# Patient Record
Sex: Female | Born: 1960 | Race: Black or African American | Hispanic: No | Marital: Married | Smoking: Former smoker
Health system: Southern US, Community
[De-identification: ages and names within clinical notes are randomized; demographics above are authoritative.]

## PROBLEM LIST (undated history)

## (undated) DIAGNOSIS — T148XXA Other injury of unspecified body region, initial encounter: Secondary | ICD-10-CM

## (undated) DIAGNOSIS — Z862 Personal history of diseases of the blood and blood-forming organs and certain disorders involving the immune mechanism: Secondary | ICD-10-CM

## (undated) DIAGNOSIS — I319 Disease of pericardium, unspecified: Secondary | ICD-10-CM

## (undated) DIAGNOSIS — D649 Anemia, unspecified: Secondary | ICD-10-CM

## (undated) DIAGNOSIS — I252 Old myocardial infarction: Secondary | ICD-10-CM

## (undated) DIAGNOSIS — Z87891 Personal history of nicotine dependence: Secondary | ICD-10-CM

## (undated) HISTORY — DX: Anemia, unspecified: D64.9

## (undated) HISTORY — DX: Personal history of nicotine dependence: Z87.891

## (undated) HISTORY — DX: Disease of pericardium, unspecified: I31.9

## (undated) HISTORY — DX: Other injury of unspecified body region, initial encounter: T14.8XXA

## (undated) HISTORY — DX: Old myocardial infarction: I25.2

## (undated) HISTORY — PX: BREAST LUMPECTOMY: SHX2

## (undated) HISTORY — PX: TONSILLECTOMY: SUR1361

## (undated) HISTORY — DX: Personal history of diseases of the blood and blood-forming organs and certain disorders involving the immune mechanism: Z86.2

---

## 2019-09-19 ENCOUNTER — Other Ambulatory Visit: Payer: Self-pay

## 2019-09-19 ENCOUNTER — Ambulatory Visit: Payer: BC Managed Care – PPO | Attending: Internal Medicine | Admitting: Internal Medicine

## 2019-09-19 ENCOUNTER — Encounter: Payer: Self-pay | Admitting: Internal Medicine

## 2019-09-19 VITALS — BP 135/88 | HR 93 | Temp 97.0°F | Resp 16 | Ht 70.0 in | Wt 216.2 lb

## 2019-09-19 DIAGNOSIS — R030 Elevated blood-pressure reading, without diagnosis of hypertension: Secondary | ICD-10-CM | POA: Diagnosis not present

## 2019-09-19 DIAGNOSIS — E6609 Other obesity due to excess calories: Secondary | ICD-10-CM | POA: Diagnosis not present

## 2019-09-19 DIAGNOSIS — Z1231 Encounter for screening mammogram for malignant neoplasm of breast: Secondary | ICD-10-CM | POA: Diagnosis not present

## 2019-09-19 DIAGNOSIS — Z6831 Body mass index (BMI) 31.0-31.9, adult: Secondary | ICD-10-CM | POA: Insufficient documentation

## 2019-09-19 DIAGNOSIS — Z7689 Persons encountering health services in other specified circumstances: Secondary | ICD-10-CM | POA: Diagnosis not present

## 2019-09-19 NOTE — Patient Instructions (Addendum)
Please sign a release for [place] to get your medical records from your previous PCP. Please check your blood pressure at least twice a week and keep a log.  Please bring in your log with your next visit.   Obesity, Adult Obesity is having too much body fat. Being obese means that your weight is more than what is healthy for you. BMI is a number that explains how much body fat you have. If you have a BMI of 30 or more, you are obese. Obesity is often caused by eating or drinking more calories than your body uses. Changing your lifestyle can help you lose weight. Obesity can cause serious health problems, such as:  Stroke.  Coronary artery disease (CAD).  Type 2 diabetes.  Some types of cancer, including cancers of the colon, breast, uterus, and gallbladder.  Osteoarthritis.  High blood pressure (hypertension).  High cholesterol.  Sleep apnea.  Gallbladder stones.  Infertility problems. What are the causes?  Eating meals each day that are high in calories, sugar, and fat.  Being born with genes that may make you more likely to become obese.  Having a medical condition that causes obesity.  Taking certain medicines.  Sitting a lot (having a sedentary lifestyle).  Not getting enough sleep.  Drinking a lot of drinks that have sugar in them. What increases the risk?  Having a family history of obesity.  Being an [place] American woman.  Being a Hispanic man.  Living in an area with limited access to: ? [facility], recreation centers, or sidewalks. ? Healthy food choices, such as grocery stores and farmers' markets. What are the signs or symptoms? The main sign is having too much body fat. How is this treated?  Treatment for this condition often includes changing your lifestyle. Treatment may include: ? Changing your diet. This may include making a healthy meal plan. ? Exercise. This may include activity that causes your heart to beat faster (aerobic exercise) and strength  training. Work with your doctor to design a program that works for you. ? Medicine to help you lose weight. This may be used if you are not able to lose 1 pound a week after 6 weeks of healthy eating and more exercise. ? Treating conditions that cause the obesity. ? Surgery. Options may include gastric banding and gastric bypass. This may be done if:  Other treatments have not helped to improve your condition.  You have a BMI of 40 or higher.  You have life-threatening health problems related to obesity. Follow these instructions at home: Eating and drinking   Follow advice from your doctor about what to eat and drink. Your doctor may tell you to: ? Limit fast food, sweets, and processed snack foods. ? Choose low-fat options. For example, choose low-fat milk instead of whole milk. ? Eat 5 or more servings of fruits or vegetables each day. ? Eat at home more often. This gives you more control over what you eat. ? Choose healthy foods when you eat out. ? Learn to read food labels. This will help you learn how much food is in 1 serving. ? Keep low-fat snacks available. ? Avoid drinks that have a lot of sugar in them. These include soda, fruit juice, iced tea with sugar, and flavored milk.  Drink enough water to keep your pee (urine) pale yellow.  Do not go on fad diets. Physical activity  Exercise often, as told by your doctor. Most adults should get up to 150 minutes of  moderate-intensity exercise every week.Ask your doctor: ? What types of exercise are safe for you. ? How often you should exercise.  Warm up and stretch before being active.  Do slow stretching after being active (cool down).  Rest between times of being active. Lifestyle  Work with your doctor and a food expert (dietitian) to set a weight-loss goal that is best for you.  Limit your screen time.  Find ways to reward yourself that do not involve food.  Do not drink alcohol if: ? Your doctor tells you not to  drink. ? You are pregnant, may be pregnant, or are planning to become pregnant.  If you drink alcohol: ? Limit how much you use to:  0-1 drink a day for women.  0-2 drinks a day for men. ? Be aware of how much alcohol is in your drink. In the [place] one drink equals one 12 oz bottle of beer (355 mL), one 5 oz glass of wine (148 mL), or one 1 oz glass of hard liquor (44 mL). General instructions  Keep a weight-loss journal. This can help you keep track of: ? The food that you eat. ? How much exercise you get.  Take over-the-counter and prescription medicines only as told by your doctor.  Take vitamins and supplements only as told by your doctor.  Think about joining a support group.  Keep all follow-up visits as told by your doctor. This is important. Contact a doctor if:  You cannot meet your weight loss goal after you have changed your diet and lifestyle for 6 weeks. Get help right away if you:  Are having trouble breathing.  Are having thoughts of harming yourself. Summary  Obesity is having too much body fat.  Being obese means that your weight is more than what is healthy for you.  Work with your doctor to set a weight-loss goal.  Get regular exercise as told by your doctor. This information is not intended to replace advice given to you by your health care provider. Make sure you discuss any questions you have with your health care provider. Document Revised: 11/12/2017 Document Reviewed: 11/12/2017 Elsevier Patient Education  2020 [employer].

## 2019-09-19 NOTE — Progress Notes (Addendum)
Patient ID: Ruth Gilmore, female    DOB: 08/25/1960  MRN: [id number]  CC: New Patient (Initial Visit)   Subjective: Ruth Gilmore is a 59 y.o. female who presents for new patient visit. Her concerns today include:   Previous PCP was in [place].  Moved to [place] 1.5 mth ago.  Husband has family in the area and went to [employer]. No chronic med issues.  Past hx of IDA due to heavy menses from fibroid which corrected after she was menopausal.  Concern about her wgh.  Nervous eating during pandemic. "I was eating all the wrong foods."  She and her spouse was doing a lot of ordering out -Wgh was 170-177 lbs pre-pandemic.  -She use to walk 3-5 miles a day but was too nervous to do so during the pandemic.   Wants to lose 20 lbs. -now back to cooking more  Smoked for 3 mths in high school and has not touch it since then.  BP: Blood pressure noted to be elevated today. Patient reports that her blood pressure increases whenever her weight increases. She also reports being told that she has a component of whitecoat hypertension as her blood pressure tends to go up when she is in a medical setting. No chest pains, shortness of breath, headaches or dizziness.  HM: last MMG as 2019. Not sure when last PAP.  Did ?FIT test last yr.  COVID Pfizer vaccine 06/27/19 and 07/18/19  Past medical, surgical, family history and social history reviewed and updated.  No current outpatient medications on file prior to visit.   No current facility-administered medications on file prior to visit.    No Known Allergies  Social History   Socioeconomic History  . Marital status: Married    Spouse name: Not on file  . Number of children: 1  . Years of education: Not on file  . Highest education level: Bachelor's degree (e.g., BA, AB, BS)  Occupational History  . Not on file  Tobacco Use  . Smoking status: Former Smoker    Types: Cigarettes  . Smokeless tobacco: Never Used  Vaping Use  .  Vaping Use: Never used  Substance and Sexual Activity  . Alcohol use: Never  . Drug use: Never  . Sexual activity: Yes    Birth control/protection: Post-menopausal  Other Topics Concern  . Not on file  Social History Narrative  . Not on file   Social Determinants of Health   Financial Resource Strain:   . Difficulty of Paying Living Expenses:   Food Insecurity:   . Worried About Programme researcher, broadcasting/film/video in the Last Year:   . Barista in the Last Year:   Transportation Needs:   . Freight forwarder (Medical):   Marland Kitchen Lack of Transportation (Non-Medical):   Physical Activity:   . Days of Exercise per Week:   . Minutes of Exercise per Session:   Stress:   . Feeling of Stress :   Social Connections:   . Frequency of Communication with Friends and Family:   . Frequency of Social Gatherings with Friends and Family:   . Attends Religious Services:   . Active Member of Clubs or Organizations:   . Attends Banker Meetings:   Marland Kitchen Marital Status:   Intimate Partner Violence:   . Fear of Current or Ex-Partner:   . Emotionally Abused:   Marland Kitchen Physically Abused:   . Sexually Abused:     Family History  Problem  Relation Age of Onset  . Leukemia Mother   . Hypertension Father   . Stroke Father   . Anemia Daughter     Past Surgical History:  Procedure Laterality Date  . BREAST LUMPECTOMY Left not sure of yr   benign cyst  . TONSILLECTOMY      ROS: Review of Systems Negative except as stated above  PHYSICAL EXAM: BP 135/88   Pulse 93   Temp (!) 97 F (36.1 C)   Resp 16   Ht 5\' 10"  (1.778 m)   Wt 216 lb 3.2 oz (98.1 kg)   SpO2 95%   BMI 31.02 kg/m   BP RT 142/94, LT arm 136/94 Physical Exam  General appearance - alert, well appearing, older African-American female and in no distress Mental status - normal mood, behavior, speech, dress, motor activity, and thought processes Eyes - pupils equal and reactive, extraocular eye movements intact Nose -  normal and patent, no erythema, discharge or polyps Mouth - mucous membranes moist, pharynx normal without lesions Neck - supple, no significant adenopathy. No thyroid enlargement or nodules. Lymphatics -no cervical or axillary lymphadenopathy Chest - clear to auscultation, no wheezes, rales or rhonchi, symmetric air entry Heart - normal rate, regular rhythm, normal S1, S2, no murmurs, rubs, clicks or gallops Extremities - peripheral pulses normal, no pedal edema, no clubbing or cyanosis   ASSESSMENT AND PLAN: 1. Encounter to establish care -pt wants to hold off on routine labs because she recently had them done through her PCP in [place].  Will sign a release for [place] to get records.  2. Class 1 obesity due to excess calories without serious comorbidity with body mass index (BMI) of 31.0 to 31.9 in adult Discussed and encourage healthy eating habits. In particular, I recommend avoiding sugary drinks, decreasing portion sizes of white carbohydrates, eating more lean meat instead of red meat or pork, and incorporating fresh fruits and vegetables into the diet. Patient is amenable to meeting with a nutritionist. -We also discussed the need for regular exercise with goal of getting in 150 minutes of moderate intensity exercise per week - Amb ref to Medical Nutrition Therapy-MNT  3. Elevated blood pressure reading without diagnosis of hypertension Discussed and encouraged DASH diet. Patient will purchase home blood pressure monitoring device. Told to check blood pressure at least twice a week and record her readings and bring those in with her on next visit.  4. Encounter for screening mammogram for malignant neoplasm of breast - MM Digital Screening; Future  Patient to sign a release at checkout first to get her medical records from her previous PCP  Patient was given the opportunity to ask questions.  Patient verbalized understanding of the plan and was able to repeat key elements of the  plan.   Orders Placed This Encounter  Procedures  . MM Digital Screening  . Amb ref to Medical Nutrition Therapy-MNT     Requested Prescriptions    No prescriptions requested or ordered in this encounter    Return in about 2 months (around 11/19/2019) for well woman exam/pap.  Karle Plumber, MD, FACP

## 2019-10-19 ENCOUNTER — Encounter: Payer: Self-pay | Admitting: Internal Medicine

## 2019-10-19 NOTE — Progress Notes (Signed)
I received patient records from [facility]. Labs: Labs done 01/20/2019 revealed A1c of 5.6, hepatitis C screen negative, CBC normal with hemoglobin and hematocrit of 13.8 and 42, lipid profile revealed total cholesterol of 170 and LDL cholesterol of 102.  Fit test done 01/26/2019 was negative.  Hepatic function panel done 05/23/2019 revealed mild elevation in ALT of 37.  Other liver enzymes were normal.  Patient had ultrasound of the abdomen complete done 03/14/2019 because of elevated liver enzymes.  This ultrasound was normal which included views of the pancreas, kidneys, spleen, liver, aortic and bifurcation and inferior vena cava. There was no mammogram or Pap smear report.

## 2019-11-21 ENCOUNTER — Other Ambulatory Visit: Payer: Self-pay

## 2019-11-21 ENCOUNTER — Ambulatory Visit: Payer: BC Managed Care – PPO | Attending: Internal Medicine | Admitting: Internal Medicine

## 2019-11-21 ENCOUNTER — Other Ambulatory Visit (HOSPITAL_COMMUNITY)
Admission: RE | Admit: 2019-11-21 | Discharge: 2019-11-21 | Disposition: A | Discharge status: Home / Self Care | Payer: BC Managed Care – PPO | Source: Ambulatory Visit | Attending: Internal Medicine | Admitting: Internal Medicine

## 2019-11-21 ENCOUNTER — Ambulatory Visit (HOSPITAL_BASED_OUTPATIENT_CLINIC_OR_DEPARTMENT_OTHER): Payer: BC Managed Care – PPO | Admitting: Pharmacist

## 2019-11-21 ENCOUNTER — Encounter: Payer: Self-pay | Admitting: Internal Medicine

## 2019-11-21 VITALS — BP 122/96 | HR 80 | Temp 98.4°F | Resp 16 | Ht 70.0 in | Wt 216.6 lb

## 2019-11-21 DIAGNOSIS — Z23 Encounter for immunization: Secondary | ICD-10-CM

## 2019-11-21 DIAGNOSIS — Z6831 Body mass index (BMI) 31.0-31.9, adult: Secondary | ICD-10-CM

## 2019-11-21 DIAGNOSIS — Z0000 Encounter for general adult medical examination without abnormal findings: Secondary | ICD-10-CM

## 2019-11-21 DIAGNOSIS — Z5320 Procedure and treatment not carried out because of patient's decision for unspecified reasons: Secondary | ICD-10-CM

## 2019-11-21 DIAGNOSIS — Z1231 Encounter for screening mammogram for malignant neoplasm of breast: Secondary | ICD-10-CM

## 2019-11-21 DIAGNOSIS — R030 Elevated blood-pressure reading, without diagnosis of hypertension: Secondary | ICD-10-CM | POA: Diagnosis not present

## 2019-11-21 DIAGNOSIS — H6122 Impacted cerumen, left ear: Secondary | ICD-10-CM

## 2019-11-21 DIAGNOSIS — Z124 Encounter for screening for malignant neoplasm of cervix: Secondary | ICD-10-CM | POA: Diagnosis not present

## 2019-11-21 DIAGNOSIS — N841 Polyp of cervix uteri: Secondary | ICD-10-CM

## 2019-11-21 DIAGNOSIS — E6609 Other obesity due to excess calories: Secondary | ICD-10-CM

## 2019-11-21 NOTE — Progress Notes (Signed)
Patient presents for vaccination against influenza per orders of Dr. Johnson. Consent given. Counseling provided. No contraindications exists. Vaccine administered without incident.  ° °Luke Van Ausdall, PharmD, CPP °Clinical Pharmacist °[facility] °[phone] ° °

## 2019-11-21 NOTE — Progress Notes (Signed)
Patient ID: Ruth Gilmore, female    DOB: 1960/10/26  MRN: [id number]  CC: Annual Exam and Gynecologic Exam   Subjective: Ruth Gilmore is a 59 y.o. female who presents for physical exam Her concerns today include:  No chronic med issues.  Past hx of IDA due to heavy menses from fibroid which corrected after she was menopausal, obesity.  HM:  Had Tdap at hosp 11/2018 in [place] when she went to hosp for dog bite. Need flu shot.  MMG ordered on last visit.  She was not called.  Obesity:  Opted not to do the nutritionist because it is not covered by her insurance.  She and her husband have been walking almost daily.  She walks about 3 miles a day with plans to get up to about 5 miles a day.  She has been cooking more rather than doing fast food.  Pt is G2P1 No abn Pap in past but last one was years ago She is post menopausal.  No post menopausal bleeding.  Hx of fibroids No vaginal dischg No dysuria Does not check breast intermittently.  Had mass removed from LT breast 4 yrs ago.  Not cancerous. No fhx of breast, cervical, uterine or ovarian cancer  Plan was to recheck BP today. She has a home device but has not been checking it. She limits salt in the foods.  Cooking more.     Patient Active Problem List   Diagnosis Date Noted  . Class 1 obesity due to excess calories without serious comorbidity with body mass index (BMI) of 31.0 to 31.9 in adult 09/19/2019  . Elevated blood pressure reading without diagnosis of hypertension 09/19/2019     No current outpatient medications on file prior to visit.   No current facility-administered medications on file prior to visit.    No Known Allergies  Social History   Socioeconomic History  . Marital status: Married    Spouse name: Not on file  . Number of children: 1  . Years of education: Not on file  . Highest education level: Bachelor's degree (e.g., BA, AB, BS)  Occupational History  . Not on file  Tobacco Use  . Smoking  status: Former Smoker    Types: Cigarettes  . Smokeless tobacco: Never Used  Vaping Use  . Vaping Use: Never used  Substance and Sexual Activity  . Alcohol use: Never  . Drug use: Never  . Sexual activity: Yes    Birth control/protection: Post-menopausal  Other Topics Concern  . Not on file  Social History Narrative  . Not on file   Social Determinants of Health   Financial Resource Strain:   . Difficulty of Paying Living Expenses: Not on file  Food Insecurity:   . Worried About Programme researcher, broadcasting/film/video in the Last Year: Not on file  . Ran Out of Food in the Last Year: Not on file  Transportation Needs:   . Lack of Transportation (Medical): Not on file  . Lack of Transportation (Non-Medical): Not on file  Physical Activity:   . Days of Exercise per Week: Not on file  . Minutes of Exercise per Session: Not on file  Stress:   . Feeling of Stress : Not on file  Social Connections:   . Frequency of Communication with Friends and Family: Not on file  . Frequency of Social Gatherings with Friends and Family: Not on file  . Attends Religious Services: Not on file  . Active Member of Clubs  or Organizations: Not on file  . Attends Banker Meetings: Not on file  . Marital Status: Not on file  Intimate Partner Violence:   . Fear of Current or Ex-Partner: Not on file  . Emotionally Abused: Not on file  . Physically Abused: Not on file  . Sexually Abused: Not on file    Family History  Problem Relation Age of Onset  . Leukemia Mother   . Hypertension Father   . Stroke Father   . Anemia Daughter     Past Surgical History:  Procedure Laterality Date  . BREAST LUMPECTOMY Left not sure of yr   benign cyst  . TONSILLECTOMY      ROS: Review of Systems  Constitutional: Negative for fever.  HENT: Negative for dental problem and hearing loss.        No dental issues at this time but has not seen a dentist in a while.  Eyes:       Last eye exam was about 2 years ago.   She wears glasses.  Respiratory: Negative for cough.   Cardiovascular: Negative for chest pain.    PHYSICAL EXAM: BP (!) 122/96   Pulse 80   Temp 98.4 F (36.9 C)   Resp 16   Ht 5\' 10"  (1.778 m)   Wt 216 lb 9.6 oz (98.2 kg)   SpO2 97%   BMI 31.08 kg/m   Wt Readings from Last 3 Encounters:  11/21/19 216 lb 9.6 oz (98.2 kg)  09/19/19 216 lb 3.2 oz (98.1 kg)    Physical Exam  General appearance - alert, well appearing, older African-American female and in no distress Mental status - normal mood, behavior, speech, dress, motor activity, and thought processes Eyes - pupils equal and reactive, extraocular eye movements intact Ears -small amount of soft wax buildup in the right ear canal.  Tympanic membrane is normal.  She has moderate amount of soft wax in the left ear obscuring view of the tympanic membrane. Nose -mild enlargement of nasal turbinates bilaterally. Mouth - mucous membranes moist, pharynx normal without lesions Neck - supple, no significant adenopathy Lymphatics - no palpable lymphadenopathy, no hepatosplenomegaly Chest - clear to auscultation, no wheezes, rales or rhonchi, symmetric air entry Heart - normal rate, regular rhythm, normal S1, S2, no murmurs, rubs, clicks or gallops Abdomen - soft, nontender, nondistended, no masses or organomegaly Breasts -CMA Pollock present for breast and pelvic exam.  Breasts appear normal, no suspicious masses, no skin or nipple changes or axillary nodes Pelvic - normal external genitalia, vulva, vagina, cervix, uterus and adnexa.  She has what looks like a polyp in the cervical os. Extremities -no lower extremity edema.   ASSESSMENT AND PLAN: 1. Annual physical exam 2. Pap smear for cervical cancer screening Patient declined STD screening. - Cytology - PAP  3. Polyp at cervical os - Ambulatory referral to Gynecology  4. Elevated blood-pressure reading without diagnosis of hypertension Continue healthy eating habits and  regular exercise.  I have asked her to check her blood pressure at least twice a week and record the readings.  She will see the clinical pharmacist in 1 month for recheck.  If home blood pressure readings are elevated, I would recommend starting her on amlodipine 5 mg daily.  5. Class 1 obesity due to excess calories without serious comorbidity with body mass index (BMI) of 31.0 to 31.9 in adult Commended her on changes that she has made in her eating habits.  Continue regular exercise  6. Impacted cerumen of left ear Patient states that she did not tolerate ear irrigation in the past.  Advised to purchase them over-the-counter wax softener and use it  7. HIV screening declined This was offered.  Patient declined  8. Encounter for screening mammogram for malignant neoplasm of breast - MM Digital Screening; Future  9. Need for influenza vaccination Given today.     Patient was given the opportunity to ask questions.  Patient verbalized understanding of the plan and was able to repeat key elements of the plan.   Orders Placed This Encounter  Procedures  . MM Digital Screening  . Ambulatory referral to Gynecology     Requested Prescriptions    No prescriptions requested or ordered in this encounter    Return in about 4 months (around 03/22/2020) for [facility] in 1 mth for BP recheck.  Jonah Blue, MD, FACP

## 2019-11-21 NOTE — Patient Instructions (Addendum)
Try to get established with a dentist for your routine dental care. Remember to schedule an eye appointment.  Try to check your blood pressure at least twice a week.  Please keep a log and bring them in in 1 month when you come to see the clinical pharmacist for recheck of your blood pressure.  Influenza Virus Vaccine injection (Fluarix) What is this medicine? INFLUENZA VIRUS VACCINE (in floo EN zuh VAHY ruhs vak SEEN) helps to reduce the risk of getting influenza also known as the flu. This medicine may be used for other purposes; ask your health care provider or pharmacist if you have questions. COMMON BRAND NAME(S): Fluarix, Fluzone What should I tell my health care provider before I take this medicine? They need to know if you have any of these conditions:  bleeding disorder like hemophilia  fever or infection  Guillain-Barre syndrome or other neurological problems  immune system problems  infection with the human immunodeficiency virus (HIV) or AIDS  low blood platelet counts  multiple sclerosis  an unusual or allergic reaction to influenza virus vaccine, eggs, chicken proteins, latex, gentamicin, other medicines, foods, dyes or preservatives  pregnant or trying to get pregnant  breast-feeding How should I use this medicine? This vaccine is for injection into a muscle. It is given by a health care professional. A copy of Vaccine Information Statements will be given before each vaccination. Read this sheet carefully each time. The sheet may change frequently. Talk to your pediatrician regarding the use of this medicine in children. Special care may be needed. Overdosage: If you think you have taken too much of this medicine contact a poison control center or emergency room at once. NOTE: This medicine is only for you. Do not share this medicine with others. What if I miss a dose? This does not apply. What may interact with this medicine?  chemotherapy or radiation  therapy  medicines that lower your immune system like etanercept, anakinra, infliximab, and adalimumab  medicines that treat or prevent blood clots like warfarin  phenytoin  steroid medicines like prednisone or cortisone  theophylline  vaccines This list may not describe all possible interactions. Give your health care provider a list of all the medicines, herbs, non-prescription drugs, or dietary supplements you use. Also tell them if you smoke, drink alcohol, or use illegal drugs. Some items may interact with your medicine. What should I watch for while using this medicine? Report any side effects that do not go away within 3 days to your doctor or health care professional. Call your health care provider if any unusual symptoms occur within 6 weeks of receiving this vaccine. You may still catch the flu, but the illness is not usually as bad. You cannot get the flu from the vaccine. The vaccine will not protect against colds or other illnesses that may cause fever. The vaccine is needed every year. What side effects may I notice from receiving this medicine? Side effects that you should report to your doctor or health care professional as soon as possible:  allergic reactions like skin rash, itching or hives, swelling of the face, lips, or tongue Side effects that usually do not require medical attention (report to your doctor or health care professional if they continue or are bothersome):  fever  headache  muscle aches and pains  pain, tenderness, redness, or swelling at site where injected  weak or tired This list may not describe all possible side effects. Call your doctor for medical advice about  side effects. You may report side effects to FDA at 1-800-FDA-1088. Where should I keep my medicine? This vaccine is only given in a clinic, pharmacy, doctor's office, or other health care setting and will not be stored at home. NOTE: This sheet is a summary. It may not cover all  possible information. If you have questions about this medicine, talk to your doctor, pharmacist, or health care provider.  2020 Elsevier/Gold Standard (2007-10-06 09:30:40)

## 2019-11-22 LAB — CYTOLOGY - PAP
Comment: NEGATIVE
Diagnosis: NEGATIVE
High risk HPV: NEGATIVE

## 2019-12-06 ENCOUNTER — Other Ambulatory Visit: Payer: Self-pay

## 2019-12-06 ENCOUNTER — Ambulatory Visit
Admission: RE | Admit: 2019-12-06 | Discharge: 2019-12-06 | Disposition: A | Discharge status: Home / Self Care | Payer: BC Managed Care – PPO | Source: Ambulatory Visit | Attending: Internal Medicine | Admitting: Internal Medicine

## 2019-12-06 DIAGNOSIS — Z1231 Encounter for screening mammogram for malignant neoplasm of breast: Secondary | ICD-10-CM

## 2019-12-22 ENCOUNTER — Ambulatory Visit: Payer: BC Managed Care – PPO | Admitting: Pharmacist

## 2020-01-12 ENCOUNTER — Encounter: Payer: BC Managed Care – PPO | Admitting: Obstetrics and Gynecology

## 2021-12-24 IMAGING — MG DIGITAL SCREENING BILAT W/ CAD
4 series · 4 of 4 positions shown · non-contrast
Comparison: None.

CLINICAL DATA: Screening.

EXAM:
DIGITAL SCREENING BILATERAL MAMMOGRAM WITH CAD

[L MLO]
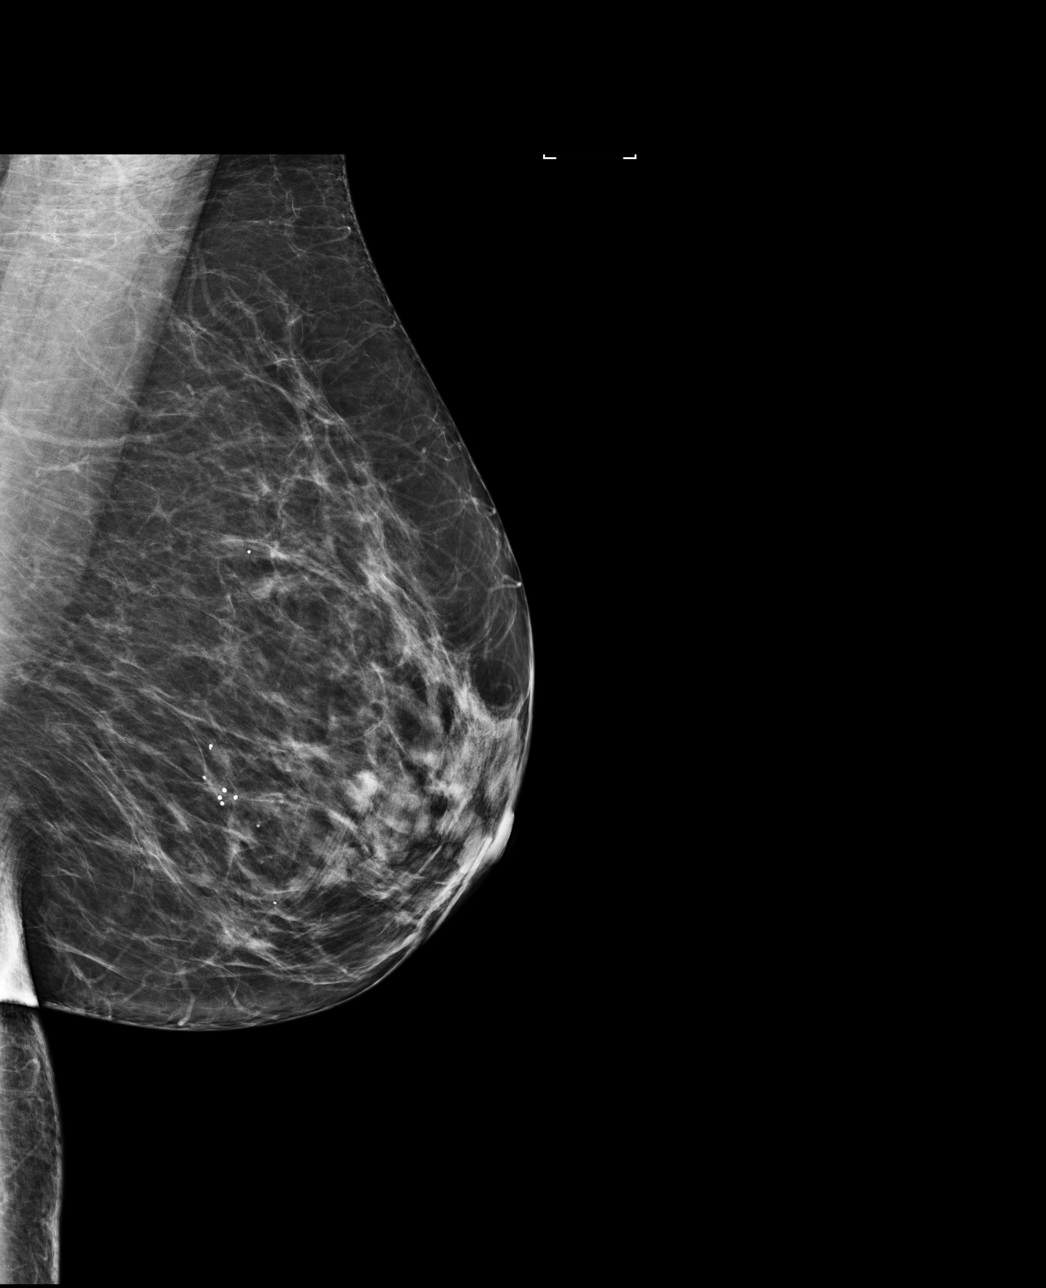

[R CC]
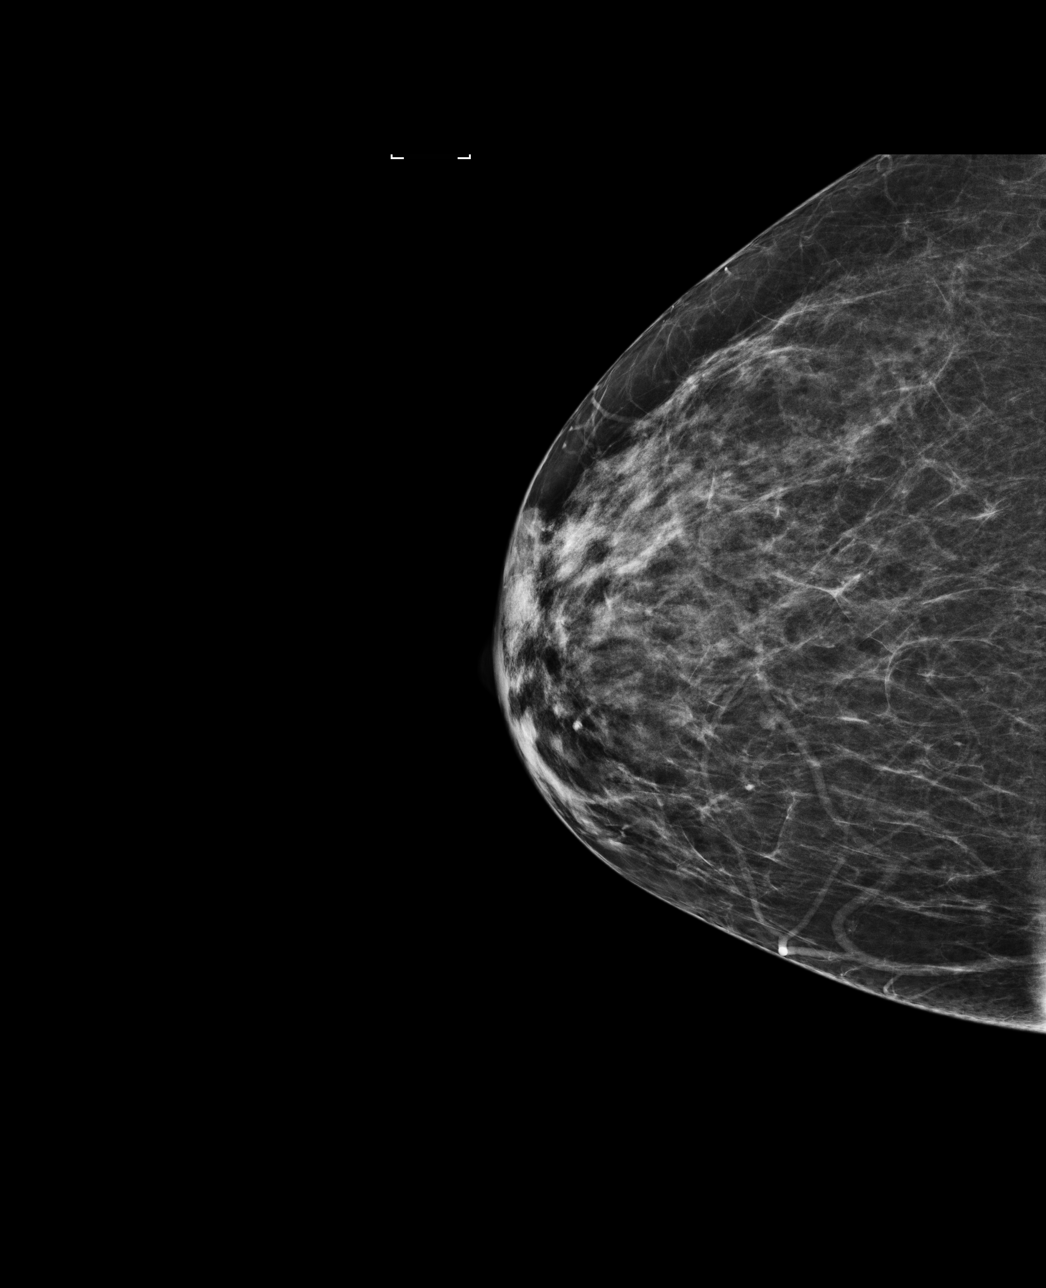

[L CC]
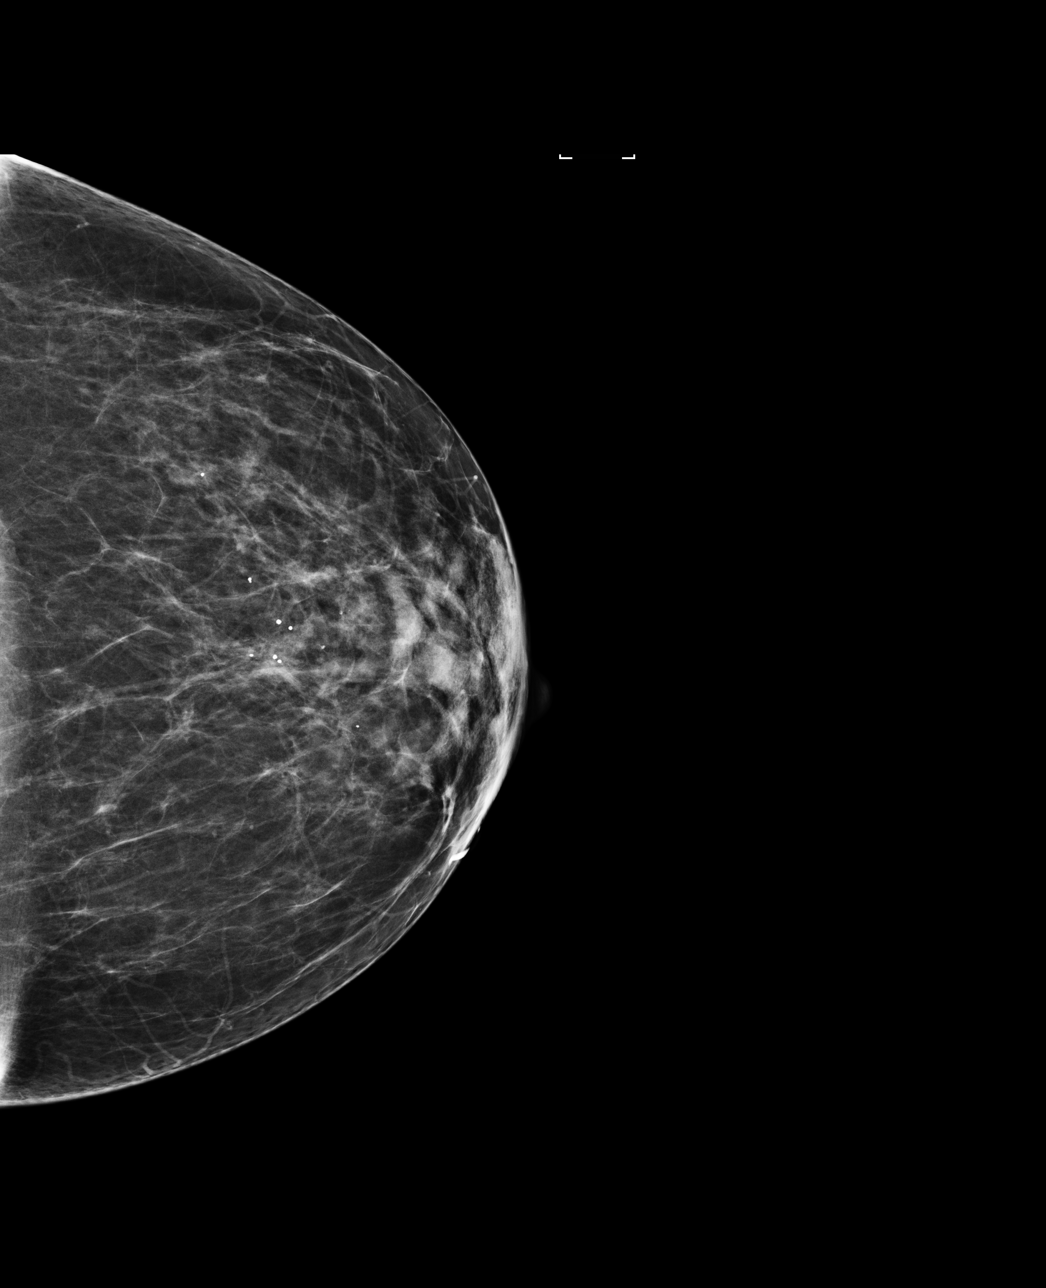

[R MLO]
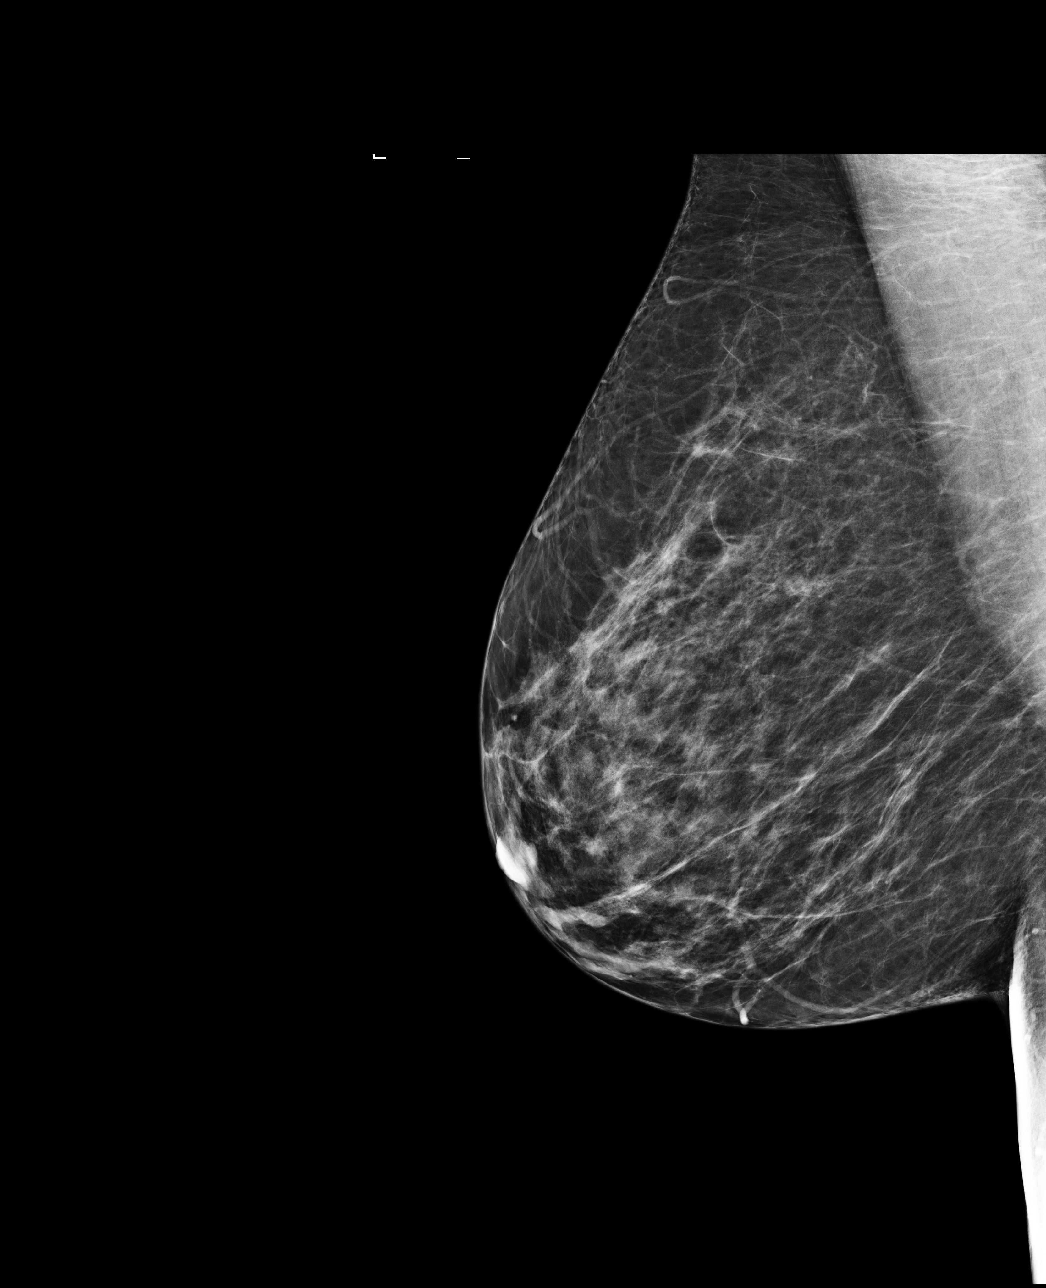

[4 of 4 positions shown; findings below may reference images not displayed]

ACR Breast Density Category c: The breast tissue is heterogeneously
dense, which may obscure small masses
FINDINGS: There are no findings suspicious for malignancy. Images were
processed with CAD.
IMPRESSION: No mammographic evidence of malignancy. A result letter of this
screening mammogram will be mailed directly to the patient.

RECOMMENDATION:
Screening mammogram in one year. (Code:U2-0-761)

BI-RADS CATEGORY  1: Negative.

## 2022-01-17 ENCOUNTER — Inpatient Hospital Stay (HOSPITAL_COMMUNITY)
Admission: EM | Admit: 2022-01-17 | Discharge: 2022-01-19 | DRG: 287 | Disposition: A | Discharge status: Home / Self Care | Payer: BC Managed Care – PPO | Source: Ambulatory Visit | Attending: Interventional Cardiology | Admitting: Interventional Cardiology

## 2022-01-17 ENCOUNTER — Other Ambulatory Visit: Payer: Self-pay

## 2022-01-17 ENCOUNTER — Emergency Department (HOSPITAL_COMMUNITY): Payer: BC Managed Care – PPO

## 2022-01-17 ENCOUNTER — Encounter (HOSPITAL_COMMUNITY): Payer: Self-pay | Admitting: Emergency Medicine

## 2022-01-17 ENCOUNTER — Encounter (HOSPITAL_COMMUNITY)
Admission: EM | Disposition: A | Discharge status: Home / Self Care | Payer: Self-pay | Source: Ambulatory Visit | Attending: Interventional Cardiology

## 2022-01-17 ENCOUNTER — Ambulatory Visit
Admission: EM | Admit: 2022-01-17 | Discharge: 2022-01-17 | Disposition: A | Discharge status: Other Acute Inpatient Hospital | Payer: BC Managed Care – PPO | Attending: Physician Assistant | Admitting: Physician Assistant

## 2022-01-17 DIAGNOSIS — R9431 Abnormal electrocardiogram [ECG] [EKG]: Secondary | ICD-10-CM | POA: Diagnosis not present

## 2022-01-17 DIAGNOSIS — I213 ST elevation (STEMI) myocardial infarction of unspecified site: Secondary | ICD-10-CM | POA: Diagnosis not present

## 2022-01-17 DIAGNOSIS — Z806 Family history of leukemia: Secondary | ICD-10-CM | POA: Diagnosis not present

## 2022-01-17 DIAGNOSIS — Z823 Family history of stroke: Secondary | ICD-10-CM

## 2022-01-17 DIAGNOSIS — R7989 Other specified abnormal findings of blood chemistry: Secondary | ICD-10-CM | POA: Diagnosis present

## 2022-01-17 DIAGNOSIS — R42 Dizziness and giddiness: Secondary | ICD-10-CM

## 2022-01-17 DIAGNOSIS — I309 Acute pericarditis, unspecified: Secondary | ICD-10-CM | POA: Diagnosis present

## 2022-01-17 DIAGNOSIS — R079 Chest pain, unspecified: Principal | ICD-10-CM

## 2022-01-17 DIAGNOSIS — Z888 Allergy status to other drugs, medicaments and biological substances status: Secondary | ICD-10-CM

## 2022-01-17 DIAGNOSIS — I319 Disease of pericardium, unspecified: Secondary | ICD-10-CM | POA: Diagnosis not present

## 2022-01-17 DIAGNOSIS — Z87891 Personal history of nicotine dependence: Secondary | ICD-10-CM

## 2022-01-17 DIAGNOSIS — R0602 Shortness of breath: Secondary | ICD-10-CM | POA: Diagnosis not present

## 2022-01-17 DIAGNOSIS — R0789 Other chest pain: Secondary | ICD-10-CM

## 2022-01-17 DIAGNOSIS — Z8249 Family history of ischemic heart disease and other diseases of the circulatory system: Secondary | ICD-10-CM

## 2022-01-17 HISTORY — PX: LEFT HEART CATH AND CORONARY ANGIOGRAPHY: CATH118249

## 2022-01-17 LAB — HEPATIC FUNCTION PANEL
ALT: 14 U/L (ref 0–44)
AST: 33 U/L (ref 15–41)
Albumin: 3.8 g/dL (ref 3.5–5.0)
Alkaline Phosphatase: 53 U/L (ref 38–126)
Bilirubin, Direct: 0.1 mg/dL (ref 0.0–0.2)
Indirect Bilirubin: 0.5 mg/dL (ref 0.3–0.9)
Total Bilirubin: 0.6 mg/dL (ref 0.3–1.2)
Total Protein: 7 g/dL (ref 6.5–8.1)

## 2022-01-17 LAB — CBC
HCT: 40.4 % (ref 36.0–46.0)
Hemoglobin: 13 g/dL (ref 12.0–15.0)
MCH: 28.2 pg (ref 26.0–34.0)
MCHC: 32.2 g/dL (ref 30.0–36.0)
MCV: 87.6 fL (ref 80.0–100.0)
Platelets: 244 10*3/uL (ref 150–400)
RBC: 4.61 MIL/uL (ref 3.87–5.11)
RDW: 14.9 % (ref 11.5–15.5)
WBC: 4.3 10*3/uL (ref 4.0–10.5)
nRBC: 0 % (ref 0.0–0.2)

## 2022-01-17 LAB — TSH: TSH: 1.54 u[IU]/mL (ref 0.350–4.500)

## 2022-01-17 LAB — BASIC METABOLIC PANEL
Anion gap: 11 (ref 5–15)
BUN: 15 mg/dL (ref 8–23)
CO2: 22 mmol/L (ref 22–32)
Calcium: 10.1 mg/dL (ref 8.9–10.3)
Chloride: 104 mmol/L (ref 98–111)
Creatinine, Ser: 0.99 mg/dL (ref 0.44–1.00)
GFR, Estimated: >60 mL/min (ref >60)
Glucose, Bld: 90 mg/dL (ref 70–99)
Potassium: 3.8 mmol/L (ref 3.5–5.1)
Sodium: 137 mmol/L (ref 135–145)

## 2022-01-17 LAB — PROTIME-INR
INR: 1.2 (ref 0.8–1.2)
Prothrombin Time: 14.8 seconds (ref 11.4–15.2)

## 2022-01-17 LAB — LIPID PANEL
Cholesterol: 163 mg/dL (ref 0–200)
HDL: 49 mg/dL (ref >40)
Total CHOL/HDL Ratio: 3.3 RATIO
Triglycerides: 101 mg/dL (ref <150)
VLDL: 20 mg/dL (ref 0–40)

## 2022-01-17 LAB — TROPONIN I (HIGH SENSITIVITY)
Troponin I (High Sensitivity): 1361 ng/L (ref <18)
Troponin I (High Sensitivity): 1428 ng/L (ref <18)

## 2022-01-17 LAB — APTT: aPTT: 144 seconds — ABNORMAL HIGH (ref 24–36)

## 2022-01-17 LAB — MAGNESIUM: Magnesium: 1.9 mg/dL (ref 1.7–2.4)

## 2022-01-17 LAB — HEMOGLOBIN A1C
Hgb A1c MFr Bld: 5.4 % (ref 4.8–5.6)
Mean Plasma Glucose: 108.28 mg/dL

## 2022-01-17 LAB — HIV ANTIBODY (ROUTINE TESTING W REFLEX): HIV Screen 4th Generation wRfx: NONREACTIVE

## 2022-01-17 SURGERY — LEFT HEART CATH AND CORONARY ANGIOGRAPHY
Anesthesia: LOCAL

## 2022-01-17 MED ORDER — ACETAMINOPHEN 325 MG PO TABS: 650.0000 mg | ORAL_TABLET | ORAL | Status: DC | PRN

## 2022-01-17 MED ORDER — FENTANYL CITRATE (PF) 100 MCG/2ML IJ SOLN
INTRAMUSCULAR | Status: DC | PRN
  Administered 2022-01-17: 25 ug via INTRAVENOUS

## 2022-01-17 MED ORDER — LIDOCAINE HCL (PF) 1 % IJ SOLN
INTRAMUSCULAR | Status: AC
  Filled 2022-01-17: qty 30

## 2022-01-17 MED ORDER — ASPIRIN 81 MG PO CHEW
324.0000 mg | CHEWABLE_TABLET | Freq: Once | ORAL | Status: AC
  Administered 2022-01-17: 324 mg via ORAL

## 2022-01-17 MED ORDER — LABETALOL HCL 5 MG/ML IV SOLN: 10.0000 mg | INTRAVENOUS | Status: AC | PRN

## 2022-01-17 MED ORDER — MIDAZOLAM HCL 2 MG/2ML IJ SOLN
INTRAMUSCULAR | Status: DC | PRN
  Administered 2022-01-17: 1 mg via INTRAVENOUS

## 2022-01-17 MED ORDER — HEPARIN SODIUM (PORCINE) 1000 UNIT/ML IJ SOLN
INTRAMUSCULAR | Status: AC
  Filled 2022-01-17: qty 10

## 2022-01-17 MED ORDER — FENTANYL CITRATE (PF) 100 MCG/2ML IJ SOLN
INTRAMUSCULAR | Status: AC
  Filled 2022-01-17: qty 2

## 2022-01-17 MED ORDER — ASPIRIN 81 MG PO CHEW: 81.0000 mg | CHEWABLE_TABLET | ORAL | Status: AC

## 2022-01-17 MED ORDER — ASPIRIN 81 MG PO CHEW: 324.0000 mg | CHEWABLE_TABLET | ORAL | Status: DC

## 2022-01-17 MED ORDER — SODIUM CHLORIDE 0.9% FLUSH: 3.0000 mL | INTRAVENOUS | Status: DC | PRN

## 2022-01-17 MED ORDER — ASPIRIN 81 MG PO TBEC
81.0000 mg | DELAYED_RELEASE_TABLET | Freq: Every day | ORAL | Status: DC
  Administered 2022-01-18 – 2022-01-19 (×2): 81 mg via ORAL
  Filled 2022-01-17 (×2): qty 1

## 2022-01-17 MED ORDER — SODIUM CHLORIDE 0.9% FLUSH
3.0000 mL | Freq: Two times a day (BID) | INTRAVENOUS | Status: DC
  Administered 2022-01-17 – 2022-01-18 (×2): 3 mL via INTRAVENOUS

## 2022-01-17 MED ORDER — SODIUM CHLORIDE 0.9 % IV SOLN: INTRAVENOUS | Status: AC

## 2022-01-17 MED ORDER — NITROGLYCERIN 1 MG/10 ML FOR IR/CATH LAB
INTRA_ARTERIAL | Status: AC
  Filled 2022-01-17: qty 10

## 2022-01-17 MED ORDER — ASPIRIN 300 MG RE SUPP: 300.0000 mg | RECTAL | Status: DC

## 2022-01-17 MED ORDER — LIDOCAINE HCL (PF) 1 % IJ SOLN
INTRAMUSCULAR | Status: DC | PRN
  Administered 2022-01-17: 2 mL

## 2022-01-17 MED ORDER — HEPARIN SODIUM (PORCINE) 5000 UNIT/ML IJ SOLN: 4000.0000 [IU] | Freq: Once | INTRAMUSCULAR | Status: DC

## 2022-01-17 MED ORDER — ONDANSETRON HCL 4 MG/2ML IJ SOLN: 4.0000 mg | Freq: Four times a day (QID) | INTRAMUSCULAR | Status: DC | PRN

## 2022-01-17 MED ORDER — SODIUM CHLORIDE 0.9 % IV SOLN: INTRAVENOUS | Status: DC

## 2022-01-17 MED ORDER — METOPROLOL TARTRATE 12.5 MG HALF TABLET
12.5000 mg | ORAL_TABLET | Freq: Two times a day (BID) | ORAL | Status: DC
  Administered 2022-01-17 – 2022-01-18 (×3): 12.5 mg via ORAL
  Filled 2022-01-17 (×3): qty 1

## 2022-01-17 MED ORDER — ATORVASTATIN CALCIUM 80 MG PO TABS
80.0000 mg | ORAL_TABLET | Freq: Every day | ORAL | Status: DC
  Administered 2022-01-17 – 2022-01-19 (×3): 80 mg via ORAL
  Filled 2022-01-17 (×4): qty 1

## 2022-01-17 MED ORDER — VERAPAMIL HCL 2.5 MG/ML IV SOLN
INTRAVENOUS | Status: DC | PRN
  Administered 2022-01-17: 10 mL via INTRA_ARTERIAL

## 2022-01-17 MED ORDER — HEPARIN SODIUM (PORCINE) 1000 UNIT/ML IJ SOLN
INTRAMUSCULAR | Status: DC | PRN
  Administered 2022-01-17: 8000 [IU] via INTRAVENOUS

## 2022-01-17 MED ORDER — VERAPAMIL HCL 2.5 MG/ML IV SOLN
INTRAVENOUS | Status: AC
  Filled 2022-01-17: qty 2

## 2022-01-17 MED ORDER — HEPARIN (PORCINE) IN NACL 1000-0.9 UT/500ML-% IV SOLN
INTRAVENOUS | Status: DC | PRN
  Administered 2022-01-17 (×2): 500 mL

## 2022-01-17 MED ORDER — SODIUM CHLORIDE 0.9 % IV SOLN: 250.0000 mL | INTRAVENOUS | Status: DC | PRN

## 2022-01-17 MED ORDER — NITROGLYCERIN 0.4 MG SL SUBL: 0.4000 mg | SUBLINGUAL_TABLET | SUBLINGUAL | Status: DC | PRN

## 2022-01-17 MED ORDER — ASPIRIN 81 MG PO CHEW: 324.0000 mg | CHEWABLE_TABLET | Freq: Once | ORAL | Status: DC

## 2022-01-17 MED ORDER — HYDRALAZINE HCL 20 MG/ML IJ SOLN: 10.0000 mg | INTRAMUSCULAR | Status: AC | PRN

## 2022-01-17 MED ORDER — HEPARIN (PORCINE) IN NACL 1000-0.9 UT/500ML-% IV SOLN
INTRAVENOUS | Status: AC
  Filled 2022-01-17: qty 1000

## 2022-01-17 MED ORDER — SODIUM CHLORIDE 0.9 % IV SOLN
INTRAVENOUS | Status: AC | PRN
  Administered 2022-01-17: 10 mL/h via INTRAVENOUS

## 2022-01-17 MED ORDER — MIDAZOLAM HCL 2 MG/2ML IJ SOLN
INTRAMUSCULAR | Status: AC
  Filled 2022-01-17: qty 2

## 2022-01-17 MED ORDER — IOHEXOL 350 MG/ML SOLN
INTRAVENOUS | Status: DC | PRN
  Administered 2022-01-17: 80 mL

## 2022-01-17 SURGICAL SUPPLY — 16 items
BAND ZEPHYR COMPRESS 30 LONG (HEMOSTASIS) IMPLANT
CATH INFINITI 5FR AL1 (CATHETERS) IMPLANT
CATH INFINITI JR4 5F (CATHETERS) IMPLANT
CATH LAUNCHER 6FR EBU3.5 (CATHETERS) IMPLANT
ELECT DEFIB PAD ADLT CADENCE (PAD) IMPLANT
GLIDESHEATH SLEND SS 6F .021 (SHEATH) IMPLANT
GUIDEWIRE INQWIRE 1.5J.035X260 (WIRE) IMPLANT
INQWIRE 1.5J .035X260CM (WIRE) ×1
KIT ENCORE 26 ADVANTAGE (KITS) IMPLANT
KIT HEART LEFT (KITS) ×1 IMPLANT
KIT HEMO VALVE WATCHDOG (MISCELLANEOUS) IMPLANT
PACK CARDIAC CATHETERIZATION (CUSTOM PROCEDURE TRAY) ×1 IMPLANT
SHEATH PROBE COVER 6X72 (BAG) IMPLANT
SYR MEDRAD MARK 7 150ML (SYRINGE) ×1 IMPLANT
TRANSDUCER W/STOPCOCK (MISCELLANEOUS) ×1 IMPLANT
TUBING CIL FLEX 10 FLL-RA (TUBING) ×1 IMPLANT

## 2022-01-17 NOTE — H&P (Addendum)
Cardiology Admission History and Physical   Patient ID: Rudine Rieger MRN: [id number]; DOB: 27-Nov-1960   Admission date: 01/17/2022  PCP:  Pcp, No    HeartCare Providers Cardiologist:  None     Chief Complaint:  chest pressure     Patient Profile:   Jerrell Hart is a 61 y.o. female with no significant PMH eats healthy and walks 5-10 miles per day who is being seen 01/17/2022 for the evaluation of STEMI.  History of Present Illness:   Ms. Antone has been in her usual health until day before yesterday 01/15/22 and she did not feel well and with activity she had chest tightness.  She would rest and it resolved.   She did not do much that day, but yesterday she had to cook for a large gathering so she was busy all day, stopping freq to rest due to chest pressure.   After cooking she walked 2 miles to get exercise in and would stop every 3 minutes due to chest pressure.  Her respirations may be slightly more with the pressure.  No N,V, Diaphoresis.   No hx of CVA or GI bleeding.  Only takes Vitamin C daily.   No FH of CAD.  In ER initial EKG  EKG:  The ECG that was done 01/17/22 was personally reviewed and demonstrates SR -mild ST elevation in I, II and V3, with Q wave in V2.  No old to compare  by 1430 EKG with progressive ST elevation including V3-V5   Hs troponin 1,361 WBC 4.3 Hgb 13 and plts 244 Na 137, K+ 3.8 glucose 90  BUN 15 and Cr 0.99  2V CXR NAD  BP 109.50 P 86 and R 14 afebrile  Her husband present for exam.    Past Medical History:  Diagnosis Date   Anemia     Past Surgical History:  Procedure Laterality Date   BREAST LUMPECTOMY Left not sure of yr   benign cyst, maybe 5 years ago   TONSILLECTOMY       Medications Prior to Admission: Prior to Admission medications   Not on File   Takes Vit C 500 daily.  Allergies:   No Known Allergies  Social History:   Social History   Socioeconomic History   Marital status: Married    Spouse  name: Not on file   Number of children: 1   Years of education: Not on file   Highest education level: Bachelor's degree (e.g., BA, AB, BS)  Occupational History   Not on file  Tobacco Use   Smoking status: Former    Types: Cigarettes   Smokeless tobacco: Never  Vaping Use   Vaping Use: Never used  Substance and Sexual Activity   Alcohol use: Never   Drug use: Never   Sexual activity: Yes    Birth control/protection: Post-menopausal  Other Topics Concern   Not on file  Social History Narrative   Not on file   Social Determinants of Health   Financial Resource Strain: Not on file  Food Insecurity: Not on file  Transportation Needs: Not on file  Physical Activity: Not on file  Stress: Not on file  Social Connections: Not on file  Intimate Partner Violence: Not on file    Family History:   The patient's family history includes Anemia in her daughter; Hypertension in her father; Leukemia in her mother; Stroke in her father.    ROS:  Please see the history of present illness.   General:no  colds or fevers, no weight changes Skin:no rashes or ulcers HEENT:no blurred vision, no congestion CV:see HPI PUL:see HPI GI:no diarrhea constipation or melena, no indigestion GU:no hematuria, no dysuria MS:no joint pain, no claudication Neuro:no syncope, no lightheadedness Endo:no diabetes, no thyroid disease All other ROS reviewed and negative.     Physical Exam/Data:   Vitals:   01/17/22 1409  BP: (!) 109/90  Pulse: 86  Resp: 17  Temp: 98.6 F (37 C)  TempSrc: Oral  SpO2: 100%   No intake or output data in the 24 hours ending 01/17/22 1719    11/21/2019   10:38 AM 09/19/2019    8:56 AM  Last 3 Weights  Weight (lbs) 216 lb 9.6 oz 216 lb 3.2 oz  Weight (kg) 98.249 kg 98.068 kg     There is no height or weight on file to calculate BMI.  General:  Well nourished, well developed, in no acute distress HEENT: normal Neck: no JVD Vascular: No carotid bruits; Distal  pulses 2+ bilaterally   Cardiac:  normal S1, S2; RRR; no murmur  Lungs:  clear to auscultation bilaterally, no wheezing, rhonchi or rales  Abd: soft, nontender, no hepatomegaly  Ext: no edema Musculoskeletal:  No deformities, BUE and BLE strength normal and equal Skin: warm and dry  Neuro:  alert and Oriented X 3 MAE follows commands, + facial symmetry, no focal abnormalities noted Psych:  Normal affect    Relevant CV Studies: none  Laboratory Data:  High Sensitivity Troponin:   Recent Labs  Lab 01/17/22 1421  TROPONINIHS 1,361*      Chemistry Recent Labs  Lab 01/17/22 1421  NA 137  K 3.8  CL 104  CO2 22  GLUCOSE 90  BUN 15  CREATININE 0.99  CALCIUM 10.1  GFRNONAA >60  ANIONGAP 11    No results for input(s): "PROT", "ALBUMIN", "AST", "ALT", "ALKPHOS", "BILITOT" in the last 168 hours. Lipids No results for input(s): "CHOL", "TRIG", "HDL", "LABVLDL", "LDLCALC", "CHOLHDL" in the last 168 hours. Hematology Recent Labs  Lab 01/17/22 1421  WBC 4.3  RBC 4.61  HGB 13.0  HCT 40.4  MCV 87.6  MCH 28.2  MCHC 32.2  RDW 14.9  PLT 244   Thyroid No results for input(s): "TSH", "FREET4" in the last 168 hours. BNPNo results for input(s): "BNP", "PROBNP" in the last 168 hours.  DDimer No results for input(s): "DDIMER" in the last 168 hours.   Radiology/Studies:  DG Chest 2 View  Result Date: 01/17/2022 CLINICAL DATA:  Chest pain EXAM: CHEST - 2 VIEW COMPARISON:  None Available. FINDINGS: The heart size and mediastinal contours are within normal limits. Both lungs are clear. The visualized skeletal structures are unremarkable. IMPRESSION: No active cardiopulmonary disease. Electronically Signed   By: Ernie Avena M.D.   On: 01/17/2022 14:45     Assessment and Plan:   STEMI, septal/lateral - per EKG, no current discomfort as long as is not active.  EKG with ST elevation, she has had 325 of ASA. -no prior hx of HTN, CVA or any medical issue The patient understands  that risks included but are not limited to stroke (1 in 1000), death (1 in 1000), kidney failure [usually temporary] (1 in 500), bleeding (1 in 200), allergic reaction [possibly serious] (1 in 200).   Will check lipids and add statin.  Hx of IDA with stable Hgb    Risk Assessment/Risk Scores:         Severity of Illness: The appropriate patient status  for this patient is INPATIENT. Inpatient status is judged to be reasonable and necessary in order to provide the required intensity of service to ensure the patient's safety. The patient's presenting symptoms, physical exam findings, and initial radiographic and laboratory data in the context of their chronic comorbidities is felt to place them at high risk for further clinical deterioration. Furthermore, it is not anticipated that the patient will be medically stable for discharge from the hospital within 2 midnights of admission.   * I certify that at the point of admission it is my clinical judgment that the patient will require inpatient hospital care spanning beyond 2 midnights from the point of admission due to high intensity of service, high risk for further deterioration and high frequency of surveillance required.*   For questions or updates, please contact [place] Please consult [email or web address] for contact info under     Signed, Cecilie Kicks, NP  01/17/2022 5:19 PM    I have examined the patient and reviewed assessment and plan and discussed with patient.  Agree with above as stated.    I personally reviewed the ECG.  I made the decision for emergent cardiac catheterization based on the abnormal ECG and abnormal troponin.  Cardiac cath revealed:  The left ventricular systolic function is normal.   LV end diastolic pressure is normal.   The left ventricular ejection fraction is 55-65% by visual estimate.   There is no aortic valve stenosis.   No angiographically apparent coronary artery disease.   Abnormal ECG  without any obstructive coronary artery disease.  Elevated troponin noted.  Would check cardiac MRI for evidence of myocarditis.   In addition, the patient has increased her exercise significantly.  She now walks 10 miles per day for exercise.  I explained to her that this is more than needed for cardiac benefit.  Continue healthy lifestyle in general including healthy diet.    Larae Grooms

## 2022-01-17 NOTE — Progress Notes (Signed)
    Unable to do cMRI on weekend have ordered for Monday.    Cecilie Kicks, FNP-C [facility]  [phone] or after 5pm and on weekends call [phone] 01/17/2022.now

## 2022-01-17 NOTE — Plan of Care (Signed)

## 2022-01-17 NOTE — ED Notes (Signed)
Patient is being discharged from the Urgent Care and sent to the Emergency Department via ems transport . Per myers, PA, patient is in need of higher level of care due to cardiac symptoms and ekg changes. Patient is aware and verbalizes understanding of plan of care.  Vitals:   01/17/22 1232  BP: 106/76  Pulse: 86  Resp: 18  Temp: 97.6 F (36.4 C)  SpO2: 96%

## 2022-01-17 NOTE — ED Triage Notes (Signed)
Patient BIB [facility] from urgent care for evaluation of chest pressure, exertional shortness of breath (worsened even by standing), and dizziness that started two days ago. Given 324mg  ASA PTA. 18g saline lock in right AC. Patient is alert, oriented, and in  no apparent distress at this time.  EMS Vitals BP 122/90 HR 70 100% on room air

## 2022-01-17 NOTE — ED Triage Notes (Signed)
Pt presents to uc with co of cp and weakness in arms and legs for 2 days, pt reports difficulty walking and doing ads. Pt reports no p;ain just extreme pressure mid sternal line. Pt reports it feels like she has has a bra that is 2 sizes too small

## 2022-01-17 NOTE — ED Provider Triage Note (Signed)
Emergency Medicine Provider Triage Evaluation Note  Ruth Gilmore , a 61 y.o. female  was evaluated in triage.  Pt complains of episodic CP and SOB for 2 days, states these sx seemed to come on w exertion and go away after stopping and sitting down. No nausea or diaphoresis. States she walks 25miles a day.   No hx of cardiac diz, htn, hld, smoking. Some cardiac history in family.   Review of Systems  Positive: CP, SOB Negative: Fever  Physical Exam  There were no vitals taken for this visit. Gen:   Awake, no distress   Resp:  Normal effort  MSK:   Moves extremities without difficulty  Other:  RRR, lungs clear  Medical Decision Making  Medically screening exam initiated at 2:09 PM.  Appropriate orders placed.  Ruth Gilmore was informed that the remainder of the evaluation will be completed by another provider, this initial triage assessment does not replace that evaluation, and the importance of remaining in the ED until their evaluation is complete.  No sx currently.  CP workup   Tedd Sias, [place] 01/17/22 1411

## 2022-01-17 NOTE — ED Provider Notes (Signed)
[facility]    CSN: [id number] Arrival date & time: 01/17/22  1224      History   Chief Complaint Chief Complaint  Patient presents with   Chest Pain    HPI Ruth Gilmore is a 61 y.o. female.   Patient here today for evaluation of intermittent chest pressure and shortness of breath that is been ongoing for the last 2 days.  She reports that when she is at rest she is not having symptoms however when she is walked across the room she has started to notice significant weakness in her arms and legs, fatigue, lightheadedness, shortness of breath and chest pressure that she describes as a feeling of wearing a bra 2 sizes too small.  She notes that after she rests temporarily, symptoms will improve.  She does not have any history of cardiac issues or hypertension.  She does not have diabetes.  She was a smoker for very short amount of time during her teenage years.  Her brother and father both required pacemakers, her brother in his 62s but no known coronary artery disease.   The history is provided by the patient.  Chest Pain Associated symptoms: shortness of breath   Associated symptoms: no abdominal pain, no fever, no nausea and no vomiting     Past Medical History:  Diagnosis Date   Anemia     Patient Active Problem List   Diagnosis Date Noted   Class 1 obesity due to excess calories without serious comorbidity with body mass index (BMI) of 31.0 to 31.9 in adult 09/19/2019   Elevated blood pressure reading without diagnosis of hypertension 09/19/2019    Past Surgical History:  Procedure Laterality Date   BREAST LUMPECTOMY Left not sure of yr   benign cyst, maybe 5 years ago   TONSILLECTOMY      OB History   No obstetric history on file.      Home Medications    Prior to Admission medications   Not on File    Family History Family History  Problem Relation Age of Onset   Leukemia Mother    Hypertension Father    Stroke Father    Anemia  Daughter     Social History Social History   Tobacco Use   Smoking status: Former    Types: Cigarettes   Smokeless tobacco: Never  Vaping Use   Vaping Use: Never used  Substance Use Topics   Alcohol use: Never   Drug use: Never     Allergies   Patient has no known allergies.   Review of Systems Review of Systems  Constitutional:  Negative for chills and fever.  HENT:  Negative for congestion and rhinorrhea.   Eyes:  Negative for discharge and redness.  Respiratory:  Positive for chest tightness and shortness of breath.   Cardiovascular:  Positive for chest pain.  Gastrointestinal:  Negative for abdominal pain, nausea and vomiting.  Neurological:  Positive for light-headedness.     Physical Exam Triage Vital Signs ED Triage Vitals  Enc Vitals Group     BP 01/17/22 1232 106/76     Pulse Rate 01/17/22 1232 86     Resp 01/17/22 1232 18     Temp 01/17/22 1232 97.6 F (36.4 C)     Temp src --      SpO2 01/17/22 1232 96 %     Weight --      Height --      Head Circumference --  Peak Flow --      Pain Score 01/17/22 1230 0     Pain Loc --      Pain Edu? --      Excl. in GC? --    No data found.  Updated Vital Signs BP 106/76   Pulse 86   Temp 97.6 F (36.4 C)   Resp 18   SpO2 96%     Physical Exam Vitals and nursing note reviewed.  Constitutional:      General: She is not in acute distress.    Appearance: Normal appearance. She is not ill-appearing.  HENT:     Head: Normocephalic and atraumatic.  Eyes:     Conjunctiva/sclera: Conjunctivae normal.  Cardiovascular:     Rate and Rhythm: Normal rate and regular rhythm.     Heart sounds: Normal heart sounds.  Pulmonary:     Effort: Pulmonary effort is normal. No respiratory distress.     Breath sounds: Normal breath sounds. No wheezing, rhonchi or rales.  Neurological:     Mental Status: She is alert.  Psychiatric:        Mood and Affect: Mood normal.        Behavior: Behavior normal.         Thought Content: Thought content normal.      UC Treatments / Results  Labs (all labs ordered are listed, but only abnormal results are displayed) Labs Reviewed - No data to display  EKG   Radiology No results found.  Procedures Procedures (including critical care time)  Medications Ordered in UC Medications  aspirin chewable tablet 324 mg (324 mg Oral Given 01/17/22 1251)    Initial Impression / Assessment and Plan / UC Course  I have reviewed the triage vital signs and the nursing notes.  Pertinent labs & imaging results that were available during my care of the patient were reviewed by me and considered in my medical decision making (see chart for details).    Patient appears stable on exam however is at rest.  Symptoms concerning for ACS, recommended further evaluation in the emergency room and transport via EMS.  Patient is agreeable to same.  IV access started in office, and aspirin administered.  Transfer of care to [facility] upon their arrival.   Final Clinical Impressions(s) / UC Diagnoses   Final diagnoses:  Chest pressure  Shortness of breath  Lightheadedness   Discharge Instructions   None    ED Prescriptions   None    PDMP not reviewed this encounter.   Tomi Bamberger, PA-C 01/17/22 1351

## 2022-01-17 NOTE — ED Provider Notes (Signed)
[facility] EMERGENCY DEPARTMENT Provider Note  CSN: [id number] Arrival date & time: 01/17/22 1352  Chief Complaint(s) Chest Pain  HPI Ruth Gilmore is a 61 y.o. female with PMH anemia who presents emergency department for evaluation of chest tightness.  Patient states symptoms have been present for approximately a day and a half and are only present with exertion.  She states that she feels a squeezing across the lower chest wall but no associated shortness of breath, diaphoresis, nausea, vomiting or other systemic symptoms.  Patient was seen at urgent care who found the patient to have an abnormal EKG and transfer the patient to the emergency department via ambulance.  Patient received aspirin 324 prior to arrival.   Past Medical History Past Medical History:  Diagnosis Date   Anemia    Patient Active Problem List   Diagnosis Date Noted   Class 1 obesity due to excess calories without serious comorbidity with body mass index (BMI) of 31.0 to 31.9 in adult 09/19/2019   Elevated blood pressure reading without diagnosis of hypertension 09/19/2019   Home Medication(s) Prior to Admission medications   Not on File                                                                                                                                    Past Surgical History Past Surgical History:  Procedure Laterality Date   BREAST LUMPECTOMY Left not sure of yr   benign cyst, maybe 5 years ago   TONSILLECTOMY     Family History Family History  Problem Relation Age of Onset   Leukemia Mother    Hypertension Father    Stroke Father    Anemia Daughter     Social History Social History   Tobacco Use   Smoking status: Former    Types: Cigarettes   Smokeless tobacco: Never  Vaping Use   Vaping Use: Never used  Substance Use Topics   Alcohol use: Never   Drug use: Never   Allergies Patient has no known allergies.  Review of Systems Review of Systems   Respiratory:  Positive for chest tightness.   Cardiovascular:  Positive for chest pain.    Physical Exam Vital Signs  I have reviewed the triage vital signs BP (!) 109/90 (BP Location: Left Arm)   Pulse 86   Temp 98.6 F (37 C) (Oral)   Resp 17   SpO2 100%   Physical Exam Vitals and nursing note reviewed.  Constitutional:      General: She is not in acute distress.    Appearance: She is well-developed.  HENT:     Head: Normocephalic and atraumatic.  Eyes:     Conjunctiva/sclera: Conjunctivae normal.  Cardiovascular:     Rate and Rhythm: Normal rate and regular rhythm.     Heart sounds: No murmur heard. Pulmonary:     Effort: Pulmonary effort is normal. No respiratory distress.  Breath sounds: Normal breath sounds.  Abdominal:     Palpations: Abdomen is soft.     Tenderness: There is no abdominal tenderness.  Musculoskeletal:        General: No swelling.     Cervical back: Neck supple.  Skin:    General: Skin is warm and dry.     Capillary Refill: Capillary refill takes less than 2 seconds.  Neurological:     Mental Status: She is alert.  Psychiatric:        Mood and Affect: Mood normal.     ED Results and Treatments Labs (all labs ordered are listed, but only abnormal results are displayed) Labs Reviewed  TROPONIN I (HIGH SENSITIVITY) - Abnormal; Notable for the following components:      Result Value   Troponin I (High Sensitivity) 1,361 (*)    All other components within normal limits  BASIC METABOLIC PANEL  CBC  HEMOGLOBIN A1C  PROTIME-INR  APTT  LIPID PANEL  TROPONIN I (HIGH SENSITIVITY)                                                                                                                          Radiology DG Chest 2 View  Result Date: 01/17/2022 CLINICAL DATA:  Chest pain EXAM: CHEST - 2 VIEW COMPARISON:  None Available. FINDINGS: The heart size and mediastinal contours are within normal limits. Both lungs are clear. The visualized  skeletal structures are unremarkable. IMPRESSION: No active cardiopulmonary disease. Electronically Signed   By: Elmer Picker M.D.   On: 01/17/2022 14:45    Pertinent labs & imaging results that were available during my care of the patient were reviewed by me and considered in my medical decision making (see MDM for details).  Medications Ordered in ED Medications  0.9 %  sodium chloride infusion (has no administration in time range)  aspirin chewable tablet 324 mg (has no administration in time range)  heparin injection 60 Units/kg (has no administration in time range)                                                                                                                                     Procedures .Critical Care  Performed by: Teressa Lower, MD Authorized by: Teressa Lower, MD   Critical care provider statement:    Critical care time (minutes):  30   Critical care was necessary to  treat or prevent imminent or life-threatening deterioration of the following conditions:  Cardiac failure (STEMI)   Critical care was time spent personally by me on the following activities:  Development of treatment plan with patient or surrogate, discussions with consultants, evaluation of patient's response to treatment, examination of patient, ordering and review of laboratory studies, ordering and review of radiographic studies, ordering and performing treatments and interventions, pulse oximetry, re-evaluation of patient's condition and review of old charts   (including critical care time)  Medical Decision Making / ED Course   This patient presents to the ED for concern of chest pain, this involves an extensive number of treatment options, and is a complaint that carries with it a high risk of complications and morbidity.  The differential diagnosis includes ACS, coronary vasospasm, PE, aortic dissection  MDM: Seen emergency room for evaluation of chest pain and chest tightness.   Physical exam largely unremarkable.  Patient's initial EKG had no EKG for comparison but did show inverted T waves in the inferior and lateral leads.  She waited in the emergency department lobby for approximately 3 hours until her troponin resulted significantly elevated at 1361.  Repeat ECG showed dynamic ST elevation in the lateral and inferior leads at which I personally went to the emergency department lobby and brought the patient back to her room and activated a STEMI alert.  I spoke with the cardiologist on-call Dr. [place] who took the patient to the Cath Lab.   Additional history obtained: -Additional history obtained from husband -External records from outside source obtained and reviewed including: Chart review including previous notes, labs, imaging, consultation notes   Lab Tests: -I ordered, reviewed, and interpreted labs.   The pertinent results include:   Labs Reviewed  TROPONIN I (HIGH SENSITIVITY) - Abnormal; Notable for the following components:      Result Value   Troponin I (High Sensitivity) 1,361 (*)    All other components within normal limits  BASIC METABOLIC PANEL  CBC  HEMOGLOBIN A1C  PROTIME-INR  APTT  LIPID PANEL  TROPONIN I (HIGH SENSITIVITY)      EKG   EKG Interpretation  Date/Time:  Friday January 17 2022 14:08:16 EDT Ventricular Rate:  87 PR Interval:  144 QRS Duration: 76 QT Interval:  400 QTC Calculation: 481 R Axis:   49 Text Interpretation: concern for developing ST elevations in inferior and lateral leads as compared to previous t wave inversions globally <<<Acute STEMI>>> Abnormal ECG When compared with ECG of 17-Jan-2022 12:37, PREVIOUS ECG IS PRESENT Confirmed by Cicilia Clinger ([id number]) on 01/17/2022 5:24:19 PM         Imaging Studies ordered: I ordered imaging studies including chest x-ray I independently visualized and interpreted imaging. I agree with the radiologist interpretation   Medicines ordered and prescription drug  management: Meds ordered this encounter  Medications   0.9 %  sodium chloride infusion   aspirin chewable tablet 324 mg   heparin injection 60 Units/kg    -I have reviewed the patients home medicines and have made adjustments as needed  Critical interventions Cath Lab activation  Consultations Obtained: I requested consultation with the cardiologist Dr. [place],  and discussed lab and imaging findings as well as pertinent plan - they recommend: Cath Lab activation   Cardiac Monitoring: The patient was maintained on a cardiac monitor.  I personally viewed and interpreted the cardiac monitored which showed an underlying rhythm of: Sinus tachycardia  Social Determinants of Health:  Factors impacting patients care include:  none   Reevaluation: After the interventions noted above, I reevaluated the patient and found that they have :stayed the same  Co morbidities that complicate the patient evaluation  Past Medical History:  Diagnosis Date   Anemia       Dispostion: I considered admission for this patient, and she was taken to the Cath Lab for management of her suspected STEMI     Final Clinical Impression(s) / ED Diagnoses Final diagnoses:  None     @PCDICTATION @    , MD 01/17/22 1736

## 2022-01-18 ENCOUNTER — Inpatient Hospital Stay (HOSPITAL_COMMUNITY): Payer: BC Managed Care – PPO

## 2022-01-18 DIAGNOSIS — I213 ST elevation (STEMI) myocardial infarction of unspecified site: Secondary | ICD-10-CM

## 2022-01-18 DIAGNOSIS — R7989 Other specified abnormal findings of blood chemistry: Secondary | ICD-10-CM | POA: Diagnosis not present

## 2022-01-18 LAB — CBC
HCT: 36.8 % (ref 36.0–46.0)
Hemoglobin: 12.3 g/dL (ref 12.0–15.0)
MCH: 28.9 pg (ref 26.0–34.0)
MCHC: 33.4 g/dL (ref 30.0–36.0)
MCV: 86.6 fL (ref 80.0–100.0)
Platelets: 224 10*3/uL (ref 150–400)
RBC: 4.25 MIL/uL (ref 3.87–5.11)
RDW: 15.1 % (ref 11.5–15.5)
WBC: 4.7 10*3/uL (ref 4.0–10.5)
nRBC: 0 % (ref 0.0–0.2)

## 2022-01-18 LAB — BASIC METABOLIC PANEL
Anion gap: 5 (ref 5–15)
BUN: 18 mg/dL (ref 8–23)
CO2: 26 mmol/L (ref 22–32)
Calcium: 9.6 mg/dL (ref 8.9–10.3)
Chloride: 107 mmol/L (ref 98–111)
Creatinine, Ser: 0.98 mg/dL (ref 0.44–1.00)
GFR, Estimated: >60 mL/min (ref >60)
Glucose, Bld: 113 mg/dL — ABNORMAL HIGH (ref 70–99)
Potassium: 3.8 mmol/L (ref 3.5–5.1)
Sodium: 138 mmol/L (ref 135–145)

## 2022-01-18 LAB — ECHOCARDIOGRAM COMPLETE
AR max vel: 2.36 cm2
AV Area VTI: 2.3 cm2
AV Area mean vel: 2.23 cm2
AV Mean grad: 3 mmHg
AV Peak grad: 6.2 mmHg
Ao pk vel: 1.24 m/s
Area-P 1/2: 2.39 cm2
S' Lateral: 2.8 cm
Weight: 3456.81 oz

## 2022-01-18 NOTE — Progress Notes (Signed)
.   Subjective:  Denies SSCP, palpitations or Dyspnea No MRI tech on weekend to do cardiac study  Objective:  Vitals:   01/18/22 0015 01/18/22 0108 01/18/22 0656 01/18/22 0845  BP: 92/64 (!) 80/67 (!) 79/63 102/77  Pulse:  73 78 83  Resp: 12 15 17 16   Temp: 97.8 F (36.6 C) 97.6 F (36.4 C) 98.2 F (36.8 C) 97.8 F (36.6 C)  TempSrc:   Oral Oral  SpO2:  98% 97% 98%  Weight:        Intake/Output from previous day:  Intake/Output Summary (Last 24 hours) at 01/18/2022 0955 Last data filed at 01/18/2022 5409 Gross per 24 hour  Intake 360 ml  Output --  Net 360 ml    Physical Exam: Affect appropriate Healthy:  appears stated age HEENT: normal Neck supple with no adenopathy JVP normal no bruits no thyromegaly Lungs clear with no wheezing and good diaphragmatic motion Heart:  S1/S2 no murmur, no rub, gallop or click PMI normal Abdomen: benighn, BS positve, no tenderness, no AAA no bruit.  No HSM or HJR Distal pulses intact with no bruits No edema Neuro non-focal Skin warm and dry No muscular weakness    Lab Results: Basic Metabolic Panel: Recent Labs    01/17/22 1421 01/17/22 2039 01/18/22 0131  NA 137  --  138  K 3.8  --  3.8  CL 104  --  107  CO2 22  --  26  GLUCOSE 90  --  113*  BUN 15  --  18  CREATININE 0.99  --  0.98  CALCIUM 10.1  --  9.6  MG  --  1.9  --    Liver Function Tests: Recent Labs    01/17/22 2039  AST 33  ALT 14  ALKPHOS 53  BILITOT 0.6  PROT 7.0  ALBUMIN 3.8   No results for input(s): "LIPASE", "AMYLASE" in the last 72 hours. CBC: Recent Labs    01/17/22 1421 01/18/22 0131  WBC 4.3 4.7  HGB 13.0 12.3  HCT 40.4 36.8  MCV 87.6 86.6  PLT 244 224   Cardiac Enzymes: No results for input(s): "CKTOTAL", "CKMB", "CKMBINDEX", "TROPONINI" in the last 72 hours. BNP: Invalid input(s): "POCBNP" D-Dimer: No results for input(s): "DDIMER" in the last 72 hours. Hemoglobin A1C: Recent Labs    01/17/22 2039  HGBA1C 5.4    Fasting Lipid Panel: Recent Labs    01/17/22 2039  CHOL 163  HDL 49  LDLCALC NOT CALCULATED  TRIG 101  CHOLHDL 3.3   Thyroid Function Tests: Recent Labs    01/17/22 2039  TSH 1.540   Anemia Panel: No results for input(s): "VITAMINB12", "FOLATE", "FERRITIN", "TIBC", "IRON", "RETICCTPCT" in the last 72 hours.  Imaging: CARDIAC CATHETERIZATION  Result Date: 01/17/2022   The left ventricular systolic function is normal.   LV end diastolic pressure is normal.   The left ventricular ejection fraction is 55-65% by visual estimate.   There is no aortic valve stenosis.   No angiographically apparent coronary artery disease. Abnormal ECG without any obstructive coronary artery disease.  Elevated troponin noted.  Would check cardiac MRI for evidence of myocarditis. In addition, the patient has increased her exercise significantly.  She now walks 10 miles per day for exercise.  I explained to her that this is more than needed for cardiac benefit.  Continue healthy lifestyle in general including healthy diet.   DG Chest 2 View  Result Date: 01/17/2022 CLINICAL DATA:  Chest pain EXAM:  CHEST - 2 VIEW COMPARISON:  None Available. FINDINGS: The heart size and mediastinal contours are within normal limits. Both lungs are clear. The visualized skeletal structures are unremarkable. IMPRESSION: No active cardiopulmonary disease. Electronically Signed   By: Ernie Avena M.D.   On: 01/17/2022 14:45    Cardiac Studies:  ECG: Acute inferior lateral ST elevation   Telemetry:  NSR no arrhythmia  Echo: pending  Medications:    aspirin  81 mg Oral Pre-Cath   aspirin EC  81 mg Oral Daily   atorvastatin  80 mg Oral Daily   heparin  4,000 Units Intravenous Once   metoprolol tartrate  12.5 mg Oral BID   sodium chloride flush  3 mL Intravenous Q12H   sodium chloride flush  3 mL Intravenous Q12H      sodium chloride     sodium chloride     sodium chloride     sodium chloride     sodium  chloride      Assessment/Plan:   Myocardial Infarction:  with pristine normal coronary arteries Clinical picture not consistent with myocarditis with no antecedent URI or illness More consistent with Takatsubo DCM with stress from cooking large meal for charity. No arrhythmia, TTE pending To my eye LV showed some apical hypokinesis Trend troponin On ASA and beta blocker Can eat. [facility] cardiac MRI tech will be here tonight at 9:00 pm and I can read her MRI in am On high dose statin although mechanism of her MI would not suggest that she needs this TC only 163 LDL not calculated LPA pending   Charlton Haws 01/18/2022, 9:55 AM

## 2022-01-18 NOTE — Progress Notes (Signed)
  Echocardiogram 2D Echocardiogram has been performed.  Ruth Gilmore 01/18/2022, 2:19 PM

## 2022-01-18 NOTE — Progress Notes (Signed)
RN notified by CCMD that patient's HR was in the 140's.  Patient was up ambulating in the hallway.  Updated patient on heart rate, she was asymptomatic, she went back to room to rest. Dr. Johnsie Cancel on department and notified, verbal order to change activity level to up as tolerated received.  Dr. Johnsie Cancel states that Cardiac MRI will be done late tonight and will be read in AM, patient updated.

## 2022-01-19 ENCOUNTER — Inpatient Hospital Stay (HOSPITAL_COMMUNITY): Payer: BC Managed Care – PPO

## 2022-01-19 DIAGNOSIS — I319 Disease of pericardium, unspecified: Secondary | ICD-10-CM | POA: Diagnosis not present

## 2022-01-19 DIAGNOSIS — I213 ST elevation (STEMI) myocardial infarction of unspecified site: Secondary | ICD-10-CM | POA: Diagnosis not present

## 2022-01-19 DIAGNOSIS — I309 Acute pericarditis, unspecified: Principal | ICD-10-CM

## 2022-01-19 DIAGNOSIS — R7989 Other specified abnormal findings of blood chemistry: Secondary | ICD-10-CM | POA: Diagnosis not present

## 2022-01-19 MED ORDER — GADOBUTROL 1 MMOL/ML IV SOLN
9.0000 mL | Freq: Once | INTRAVENOUS | Status: AC | PRN
  Administered 2022-01-19: 9 mL via INTRAVENOUS

## 2022-01-19 MED ORDER — COLCHICINE 0.6 MG PO TABS: 0.6000 mg | ORAL_TABLET | Freq: Two times a day (BID) | ORAL | 0 refills | Status: AC

## 2022-01-19 MED ORDER — METOPROLOL TARTRATE 25 MG PO TABS
25.0000 mg | ORAL_TABLET | Freq: Two times a day (BID) | ORAL | Status: DC
  Administered 2022-01-19: 25 mg via ORAL
  Filled 2022-01-19: qty 1

## 2022-01-19 MED ORDER — PANTOPRAZOLE SODIUM 20 MG PO TBEC
20.0000 mg | DELAYED_RELEASE_TABLET | Freq: Every day | ORAL | Status: DC
  Administered 2022-01-19: 20 mg via ORAL
  Filled 2022-01-19: qty 1

## 2022-01-19 MED ORDER — METOPROLOL TARTRATE 25 MG PO TABS: 25.0000 mg | ORAL_TABLET | Freq: Two times a day (BID) | ORAL | 1 refills | Status: DC

## 2022-01-19 MED ORDER — PANTOPRAZOLE SODIUM 20 MG PO TBEC: 20.0000 mg | DELAYED_RELEASE_TABLET | Freq: Every day | ORAL | 1 refills | Status: DC

## 2022-01-19 MED ORDER — IBUPROFEN 400 MG PO TABS: 400.0000 mg | ORAL_TABLET | Freq: Three times a day (TID) | ORAL | 0 refills | Status: AC

## 2022-01-19 MED ORDER — IBUPROFEN 200 MG PO TABS
400.0000 mg | ORAL_TABLET | Freq: Three times a day (TID) | ORAL | Status: DC
  Administered 2022-01-19: 400 mg via ORAL
  Filled 2022-01-19: qty 2

## 2022-01-19 MED ORDER — ASPIRIN 81 MG PO TBEC: 81.0000 mg | DELAYED_RELEASE_TABLET | Freq: Every day | ORAL | 12 refills | Status: DC

## 2022-01-19 MED ORDER — COLCHICINE 0.6 MG PO TABS
0.6000 mg | ORAL_TABLET | Freq: Two times a day (BID) | ORAL | Status: DC
  Administered 2022-01-19: 0.6 mg via ORAL
  Filled 2022-01-19: qty 1

## 2022-01-19 NOTE — Progress Notes (Signed)
.  Cardilogist:  [place]ott  Subjective:  Denies SSCP, palpitations or Dyspnea Tachycardia with any activity  Objective:  Vitals:   01/18/22 1629 01/18/22 1916 01/18/22 2120 01/19/22 0509  BP: (!) 82/62 102/76 112/82 111/80  Pulse: 78 87 92 80  Resp: 17 16  17   Temp: 97.6 F (36.4 C) 98.5 F (36.9 C)  97.8 F (36.6 C)  TempSrc: Oral Oral  Oral  SpO2: 97% 97% 98% 96%  Weight:    81.3 kg    Intake/Output from previous day:  Intake/Output Summary (Last 24 hours) at 01/19/2022 0804 Last data filed at 01/18/2022 2120 Gross per 24 hour  Intake 603 ml  Output --  Net 603 ml    Physical Exam: Affect appropriate Healthy:  appears stated age HEENT: normal Neck supple with no adenopathy JVP normal no bruits no thyromegaly Lungs clear with no wheezing and good diaphragmatic motion Heart:  S1/S2 no murmur, no rub, gallop or click PMI normal Abdomen: benighn, BS positve, no tenderness, no AAA no bruit.  No HSM or HJR Distal pulses intact with no bruits No edema Neuro non-focal Skin warm and dry No muscular weakness    Lab Results: Basic Metabolic Panel: Recent Labs    01/17/22 1421 01/17/22 2039 01/18/22 0131  NA 137  --  138  K 3.8  --  3.8  CL 104  --  107  CO2 22  --  26  GLUCOSE 90  --  113*  BUN 15  --  18  CREATININE 0.99  --  0.98  CALCIUM 10.1  --  9.6  MG  --  1.9  --    Liver Function Tests: Recent Labs    01/17/22 2039  AST 33  ALT 14  ALKPHOS 53  BILITOT 0.6  PROT 7.0  ALBUMIN 3.8   No results for input(s): "LIPASE", "AMYLASE" in the last 72 hours. CBC: Recent Labs    01/17/22 1421 01/18/22 0131  WBC 4.3 4.7  HGB 13.0 12.3  HCT 40.4 36.8  MCV 87.6 86.6  PLT 244 224    Hemoglobin A1C: Recent Labs    01/17/22 2039  HGBA1C 5.4   Fasting Lipid Panel: Recent Labs    01/17/22 2039  CHOL 163  HDL 49  LDLCALC NOT CALCULATED  TRIG 101  CHOLHDL 3.3   Thyroid Function Tests: Recent Labs    01/17/22 2039  TSH 1.540    Anemia Panel: No results for input(s): "VITAMINB12", "FOLATE", "FERRITIN", "TIBC", "IRON", "RETICCTPCT" in the last 72 hours.  Imaging: MR CARDIAC MORPHOLOGY W WO CONTRAST  Result Date: 01/19/2022 CLINICAL DATA:  Subendocardial Myocardial Infarction with Normal Coronary Arteries EXAM: CARDIAC MRI TECHNIQUE: The patient was scanned on a 1.5 Tesla Siemens magnet. A dedicated cardiac coil was used. Functional imaging was done using Fiesta sequences. 2,3, and 4 chamber views were done to assess for RWMA's. Modified Simpson's rule using a short axis stack was used to calculate an ejection fraction on a dedicated work Conservation officer, nature. The patient received 9 cc of Gadavist. After 10 minutes inversion recovery sequences were used to assess for infiltration and scar tissue. CONTRAST:  Gadavist FINDINGS: Normal atrial sizes. No ASD/PFO. Normal ascending thoracic aorta 3.2 cm Normal cardiac valves with tri leaflet AV. Mild LVE no significant LVH septal Thickness 9 mm. Quantitative EF 45% (EDV 130 cc ESV 72 cc SV 58 cc) Abnormal septal motion with distal septal and apical hypokinesis Normal RV size and function quantitative RVEF 50% (EDV  147 cc, ESV 73 cc SV 74 cc ) Trivial pericardial effusion posterior and lateral to LV. Post gadolinium there is uptake in the pericardium over the RV free wall and LV apex. There is also Mild uptake in the LV apical myocardium T 2 map in the apical segment abnormal at 66 msec [facility] criteria for myopericarditis IMPRESSION: 1.  Mild LVE with distal septal and apical hypokinesis LVEF 45% 2. Gadolinium uptake in RV free wall pericardium and LV apical myocardium 3.  Elevated T2 in apical LV myocardium 66 msec 4.  Findings meet [facility] criteria for myopericarditis 5.  Normal RV size and function RVEF 50% 6.  Trivial pericardial effusion posterior and lateral to LV 7.  Normal cardiac valves 8.  Normal ascending thoracic aorta 3.2 cm Jenkins Rouge Electronically  Signed   By: Jenkins Rouge M.D.   On: 01/19/2022 07:31   ECHOCARDIOGRAM COMPLETE  Result Date: 01/18/2022    ECHOCARDIOGRAM REPORT   Patient Name:   Ruth Gilmore Date of Exam: 01/18/2022 Medical Rec #:  [id number]        Height:       70.0 in Accession #:    [id number]       Weight:       216.0 lb Date of Birth:  10/17/1960        BSA:          2.157 m Patient Age:    61 years         BP:           110/78 mmHg Patient Gender: F                HR:           67 bpm. Exam Location:  Inpatient Procedure: 2D Echo, Cardiac Doppler and Color Doppler Indications:    Acute myocardial infarction  History:        Patient has no prior history of Echocardiogram examinations.  Sonographer:    Clayton Lefort RDCS (AE) Referring Phys: Isaiah Serge IMPRESSIONS  1. Left ventricular ejection fraction, by estimation, is 55 to 60%. The left ventricle has normal function. The left ventricle demonstrates regional wall motion abnormalities (relative apical hypokinesis without apical ballooning). Left ventricular diastolic parameters are consistent with Grade I diastolic dysfunction (impaired relaxation).  2. Right ventricular systolic function is normal. The right ventricular size is normal. There is normal pulmonary artery systolic pressure.  3. The mitral valve is normal in structure. No evidence of mitral valve regurgitation. No evidence of mitral stenosis.  4. The aortic valve was not well visualized. Aortic valve regurgitation is not visualized.  5. The inferior vena cava is normal in size with greater than 50% respiratory variability, suggesting right atrial pressure of 3 mmHg. Comparison(s): No prior Echocardiogram. Conclusion(s)/Recommendation(s): Consistent with stress cardiomyopathy or LAD disease. FINDINGS  Left Ventricle: Left ventricular ejection fraction, by estimation, is 55 to 60%. The left ventricle has normal function. The left ventricle demonstrates regional wall motion abnormalities. The left ventricular internal  cavity size was normal in size. There is no left ventricular hypertrophy. Left ventricular diastolic parameters are consistent with Grade I diastolic dysfunction (impaired relaxation).  LV Wall Scoring: The apical lateral segment, apical septal segment, apical anterior segment, and apical inferior segment are hypokinetic. Right Ventricle: The right ventricular size is normal. No increase in right ventricular wall thickness. Right ventricular systolic function is normal. There is normal pulmonary artery systolic pressure. The tricuspid regurgitant velocity  is 2.27 m/s, and  with an assumed right atrial pressure of 3 mmHg, the estimated right ventricular systolic pressure is 123456 mmHg. Left Atrium: Left atrial size was normal in size. Right Atrium: Right atrial size was normal in size. Pericardium: There is no evidence of pericardial effusion. Mitral Valve: The mitral valve is normal in structure. No evidence of mitral valve regurgitation. No evidence of mitral valve stenosis. Tricuspid Valve: The tricuspid valve is normal in structure. Tricuspid valve regurgitation is not demonstrated. No evidence of tricuspid stenosis. Aortic Valve: The aortic valve was not well visualized. Aortic valve regurgitation is not visualized. Aortic valve mean gradient measures 3.0 mmHg. Aortic valve peak gradient measures 6.2 mmHg. Aortic valve area, by VTI measures 2.30 cm. Pulmonic Valve: The pulmonic valve was not well visualized. Pulmonic valve regurgitation is not visualized. Aorta: The aortic root is normal in size and structure. Venous: The inferior vena cava is normal in size with greater than 50% respiratory variability, suggesting right atrial pressure of 3 mmHg. IAS/Shunts: No atrial level shunt detected by color flow Doppler.  LEFT VENTRICLE PLAX 2D LVIDd:         4.00 cm   Diastology LVIDs:         2.80 cm   LV e' medial:    6.42 cm/s LV PW:         1.30 cm   LV E/e' medial:  10.8 LV IVS:        1.00 cm   LV e' lateral:    6.85 cm/s LVOT diam:     1.90 cm   LV E/e' lateral: 10.1 LV SV:         55 LV SV Index:   26 LVOT Area:     2.84 cm  RIGHT VENTRICLE RV Basal diam:  3.00 cm RV S prime:     13.70 cm/s TAPSE (M-mode): 2.1 cm LEFT ATRIUM             Index        RIGHT ATRIUM           Index LA diam:        3.00 cm 1.39 cm/m   RA Area:     14.00 cm LA Vol (A2C):   32.1 ml 14.88 ml/m  RA Volume:   35.10 ml  16.27 ml/m LA Vol (A4C):   36.2 ml 16.78 ml/m LA Biplane Vol: 34.2 ml 15.86 ml/m  AORTIC VALVE AV Area (Vmax):    2.36 cm AV Area (Vmean):   2.23 cm AV Area (VTI):     2.30 cm AV Vmax:           124.00 cm/s AV Vmean:          81.500 cm/s AV VTI:            0.240 m AV Peak Grad:      6.2 mmHg AV Mean Grad:      3.0 mmHg LVOT Vmax:         103.00 cm/s LVOT Vmean:        64.000 cm/s LVOT VTI:          0.195 m LVOT/AV VTI ratio: 0.81  AORTA Ao Root diam: 3.10 cm MITRAL VALVE               TRICUSPID VALVE MV Area (PHT): 2.39 cm    TR Peak grad:   20.6 mmHg MV Decel Time: 317 msec    TR Vmax:  227.00 cm/s MV E velocity: 69.30 cm/s MV A velocity: 90.80 cm/s  SHUNTS MV E/A ratio:  0.76        Systemic VTI:  0.20 m                            Systemic Diam: 1.90 cm Rudean Haskell MD Electronically signed by Rudean Haskell MD Signature Date/Time: 01/18/2022/3:30:20 PM    Final    CARDIAC CATHETERIZATION  Result Date: 01/17/2022   The left ventricular systolic function is normal.   LV end diastolic pressure is normal.   The left ventricular ejection fraction is 55-65% by visual estimate.   There is no aortic valve stenosis.   No angiographically apparent coronary artery disease. Abnormal ECG without any obstructive coronary artery disease.  Elevated troponin noted.  Would check cardiac MRI for evidence of myocarditis. In addition, the patient has increased her exercise significantly.  She now walks 10 miles per day for exercise.  I explained to her that this is more than needed for cardiac benefit.  Continue  healthy lifestyle in general including healthy diet.   DG Chest 2 View  Result Date: 01/17/2022 CLINICAL DATA:  Chest pain EXAM: CHEST - 2 VIEW COMPARISON:  None Available. FINDINGS: The heart size and mediastinal contours are within normal limits. Both lungs are clear. The visualized skeletal structures are unremarkable. IMPRESSION: No active cardiopulmonary disease. Electronically Signed   By: Elmer Picker M.D.   On: 01/17/2022 14:45    Cardiac Studies:  ECG: Acute inferior lateral ST elevation   Telemetry:  NSR no arrhythmia  Echo: EF preserved apical hypokinesis no effusion  Medications:    aspirin EC  81 mg Oral Daily   atorvastatin  80 mg Oral Daily   heparin  4,000 Units Intravenous Once   metoprolol tartrate  12.5 mg Oral BID   sodium chloride flush  3 mL Intravenous Q12H      sodium chloride     sodium chloride      Assessment/Plan:   Myocardial Infarction:  with pristine normal coronary arteries MRI read by myself this am consistent with myopericarditis with elevated T2 in apex , mild gadolinium uptake and uptake in the pericardium. Will increase beta blocker start colchicine and high dose NSAI's  No chest pain HR improved ok to d/c today with outpatient f/u Dr Georgina [facility] 01/19/2022, 8:04 AM

## 2022-01-19 NOTE — Discharge Summary (Signed)
Discharge Summary    Patient ID: Ruth Gilmore MRN: [id number]; DOB: 1960/12/09  Admit date: 01/17/2022 Discharge date: 01/19/2022  PCP:  Oneita Hurt, No   [facility] Providers Cardiologist:  Lance Muss, MD        Discharge Diagnoses    Principal Problem:   Acute myopericarditis Active Problems:   Elevated troponin   Diagnostic Studies/Procedures   Cardiac Catheterization: 01/17/2022   The left ventricular systolic function is normal.   LV end diastolic pressure is normal.   The left ventricular ejection fraction is 55-65% by visual estimate.   There is no aortic valve stenosis.   No angiographically apparent coronary artery disease.   Abnormal ECG without any obstructive coronary artery disease.  Elevated troponin noted.  Would check cardiac MRI for evidence of myocarditis.   In addition, the patient has increased her exercise significantly.  She now walks 10 miles per day for exercise.  I explained to her that this is more than needed for cardiac benefit.  Continue healthy lifestyle in general including healthy diet.  Echocardiogram: 01/18/2022 IMPRESSIONS     1. Left ventricular ejection fraction, by estimation, is 55 to 60%. The  left ventricle has normal function. The left ventricle demonstrates  regional wall motion abnormalities (relative apical hypokinesis without  apical ballooning). Left ventricular  diastolic parameters are consistent with Grade I diastolic dysfunction  (impaired relaxation).   2. Right ventricular systolic function is normal. The right ventricular  size is normal. There is normal pulmonary artery systolic pressure.   3. The mitral valve is normal in structure. No evidence of mitral valve  regurgitation. No evidence of mitral stenosis.   4. The aortic valve was not well visualized. Aortic valve regurgitation  is not visualized.   5. The inferior vena cava is normal in size with greater than 50%  respiratory variability,  suggesting right atrial pressure of 3 mmHg.   Comparison(s): No prior Echocardiogram.   cMRI: 01/19/2022 IMPRESSION: 1.  Mild LVE with distal septal and apical hypokinesis LVEF 45%   2. Gadolinium uptake in RV free wall pericardium and LV apical myocardium   3.  Elevated T2 in apical LV myocardium 66 msec   4.  Findings meet [facility] criteria for myopericarditis   5.  Normal RV size and function RVEF 50%   6.  Trivial pericardial effusion posterior and lateral to LV   7.  Normal cardiac valves   8.  Normal ascending thoracic aorta 3.2 cm   History of Present Illness     Ruth Gilmore is a 61 y.o. female with past medical history of anemia and no prior cardiac history who presented to [facility] ED on 01/17/2022 for evaluation of worsening chest pain, dyspnea on exertion and weakness for the past two days. Reported her pain would occur with activity and improve with rest. Her initial EKG showed mild ST elevation along the inferior leads and Hs Troponin values were elevated to 1361 and 1428. She was admitted for further management and an emergent cardiac catheterization was performed.   Hospital Course     Consultants: None   Catheterization by Dr. Eldridge Dace showed no angiographically apparent coronary artery disease and EF was normal at 55-65%. Echocardiogram showed a preserved EF of 55-60% with relative apical hypokinesis without apical ballooning.   The following morning, she denied any recurrent chest pain. Given her elevated enzymes and WMA, a cMRI was recommended for additional evaluation. This showed mild LVE with distal septal and apical  hypokinesis with EF at 45% with gadolinium uptake in the RV free wall pericardium and LV apical myocardium and criteria overall consistent with myopericarditis. She did have normal RV size and function with a trivial pericardial effusion and normal ascending thoracic aorta 3.2 cm. Dr. Eden Emms did recommend treatment with high-dose NSAIDS of  Ibuprofen 400mg  TID for 2 weeks and Colchicine 0.6mg  BID for 3 months. Will start PPI for GI protection. Will continue ASA 81mg  daily but no statin currently (LPA pending). She was examined by Dr. and deemed stable for discharge. A TOC Appt has been arranged and a staff message was sent to the office to arrange for a TOC call.     Did the patient have an acute coronary syndrome (MI, NSTEMI, STEMI, etc) this admission?:  Yes                               AHA/ACC Clinical Performance & Quality Measures: Aspirin prescribed? - Yes ADP Receptor Inhibitor (Plavix/Clopidogrel, Brilinta/Ticagrelor or Effient/Prasugrel) prescribed (includes medically managed patients)? - No - Myopericarditis Beta Blocker prescribed? - Yes High Intensity Statin (Lipitor 40-80mg  or Crestor 20-40mg ) prescribed? - No - Myopericarditis EF assessed during THIS hospitalization? - Yes For EF <40%, was ACEI/ARB prescribed? - Not Applicable (EF >/= 40%) For EF <40%, Aldosterone Antagonist (Spironolactone or Eplerenone) prescribed? - Not Applicable (EF >/= 40%) Cardiac Rehab Phase II ordered (including medically managed patients)? - No - Myopericarditis      The patient has been scheduled for a TOC follow up appointment. A message has been sent to the [facility] Pool at the office where the patient should be seen for follow up.  _____________  Discharge Vitals Blood pressure 111/80, pulse 80, temperature 97.8 F (36.6 C), temperature source Oral, resp. rate 17, weight 81.3 kg, SpO2 96 %.  Filed Weights   01/17/22 2200 01/19/22 0509  Weight: 98 kg 81.3 kg    Labs & Radiologic Studies    CBC Recent Labs    01/17/22 1421 01/18/22 0131  WBC 4.3 4.7  HGB 13.0 12.3  HCT 40.4 36.8  MCV 87.6 86.6  PLT 244 224   Basic Metabolic Panel Recent Labs    01/19/22 1421 01/17/22 2039 01/18/22 0131  NA 137  --  138  K 3.8  --  3.8  CL 104  --  107  CO2 22  --  26  GLUCOSE 90  --  113*  BUN 15  --  18  CREATININE 0.99   --  0.98  CALCIUM 10.1  --  9.6  MG  --  1.9  --    Liver Function Tests Recent Labs    01/17/22 2039  AST 33  ALT 14  ALKPHOS 53  BILITOT 0.6  PROT 7.0  ALBUMIN 3.8   No results for input(s): "LIPASE", "AMYLASE" in the last 72 hours. High Sensitivity Troponin:   Recent Labs  Lab 01/17/22 1421 01/17/22 2039  TROPONINIHS 1,361* 1,428*    BNP Invalid input(s): "POCBNP" D-Dimer No results for input(s): "DDIMER" in the last 72 hours. Hemoglobin A1C Recent Labs    01/17/22 2039  HGBA1C 5.4   Fasting Lipid Panel Recent Labs    01/17/22 2039  CHOL 163  HDL 49  LDLCALC NOT CALCULATED  TRIG 101  CHOLHDL 3.3   Thyroid Function Tests Recent Labs    01/17/22 2039  TSH 1.540   _____________  DG Chest 2 View  Result Date: 01/17/2022 CLINICAL DATA:  Chest pain EXAM: CHEST - 2 VIEW COMPARISON:  None Available. FINDINGS: The heart size and mediastinal contours are within normal limits. Both lungs are clear. The visualized skeletal structures are unremarkable. IMPRESSION: No active cardiopulmonary disease. Electronically Signed   By: Elmer Picker M.D.   On: 01/17/2022 14:45   Disposition   Pt is being discharged home today in good condition.  Follow-up Plans & Appointments     Follow-up Information     Liliane Shi, PA-C Follow up.   Specialties: Cardiology, Physician Assistant Why: Cardiology Hospital Follow-up on 01/29/2022 at 10:55 AM. Arrive 15 minutes early. Contact information: [address] [place] [address] [phone]                Discharge Instructions     Diet - low sodium heart healthy   Complete by: As directed    Discharge instructions   Complete by: As directed    PLEASE REMEMBER TO BRING ALL OF YOUR MEDICATIONS TO EACH OF YOUR FOLLOW-UP OFFICE VISITS.  PLEASE ATTEND ALL SCHEDULED FOLLOW-UP APPOINTMENTS.   Activity: Increase activity slowly as tolerated. You may shower, but no soaking baths (or  swimming) for 1 week. No driving for 24 hours. No lifting over 5 lbs for 1 week. No sexual activity for 1 week.   You May Return to Work: in 1 week (if applicable)  Wound Care: You may wash cath site gently with soap and water. Keep cath site clean and dry. If you notice pain, swelling, bleeding or pus at your cath site, please call [phone].   Increase activity slowly   Complete by: As directed         Discharge Medications   Allergies as of 01/19/2022       Reactions   Cortisone Swelling   When used topically        Medication List     TAKE these medications    ascorbic acid 500 MG tablet Commonly known as: VITAMIN C Take 500 mg by mouth daily.   aspirin EC 81 MG tablet Take 1 tablet (81 mg total) by mouth daily. Swallow whole. Start taking on: January 20, 2022   colchicine 0.6 MG tablet Take 1 tablet (0.6 mg total) by mouth 2 (two) times daily.   ibuprofen 400 MG tablet Commonly known as: ADVIL Take 1 tablet (400 mg total) by mouth 3 (three) times daily for 14 days. What changed:  medication strength how much to take when to take this reasons to take this   metoprolol tartrate 25 MG tablet Commonly known as: LOPRESSOR Take 1 tablet (25 mg total) by mouth 2 (two) times daily.   pantoprazole 20 MG tablet Commonly known as: PROTONIX Take 1 tablet (20 mg total) by mouth daily.           Outstanding Labs/Studies   None  Duration of Discharge Encounter   Greater than 30 minutes including physician time.  Signed, Erma Heritage, PA-C 01/19/2022, 10:12 AM

## 2022-01-20 ENCOUNTER — Telehealth: Payer: Self-pay

## 2022-01-20 ENCOUNTER — Encounter (HOSPITAL_COMMUNITY): Payer: Self-pay | Admitting: Interventional Cardiology

## 2022-01-20 NOTE — Telephone Encounter (Signed)
**Note De-Identified Ruth Gilmore Obfuscation** Patient contacted regarding discharge from [facility] on 01/19/2022.  Patient understands to follow up with provider Richardson Dopp, PA-c on 01/29/1022 at 10:55 at [address] [place] in [place], [place] [address].  Patient understands discharge instructions? Yes Patient understands medications and regiment? Yes Patient understands to bring all medications to this visit? Yes  Ask patient:  Are you enrolled in My Chart: Yes  The pt reports that she has been doing well since returning home from the hospital yesterday. She denies having any CP/discomfort, SOB, nausea, vomiting, diaphoresis, dizziness, or lightheadedness.  She verified that she does have [facility] phone number and she is aware to call us if she has any questions or concerns. She thanked me for calling her.

## 2022-01-20 NOTE — Telephone Encounter (Signed)
**Note De-identified Eve Rey Obfuscation** -----  **Note De-Identified Alyxander Kollmann Obfuscation** Message from Erma Heritage, [place] sent at 01/19/2022 10:00 AM EDT ----- Regarding: TOC Call Good morning,   This patient is being discharged on 01/19/2022 and needs a TOC Call. She already has follow-up scheduled with Richardson Dopp on 01/29/2022.  Thanks,  [place]

## 2022-01-21 LAB — LIPOPROTEIN A (LPA): Lipoprotein (a): 44 nmol/L — ABNORMAL HIGH (ref <75.0)

## 2022-01-29 ENCOUNTER — Encounter: Payer: Self-pay | Admitting: Nurse Practitioner

## 2022-01-29 ENCOUNTER — Ambulatory Visit: Payer: BC Managed Care – PPO | Attending: Physician Assistant | Admitting: Nurse Practitioner

## 2022-01-29 VITALS — BP 110/70 | HR 84 | Ht 70.0 in | Wt 181.6 lb

## 2022-01-29 DIAGNOSIS — I3139 Other pericardial effusion (noninflammatory): Secondary | ICD-10-CM | POA: Diagnosis not present

## 2022-01-29 DIAGNOSIS — T148XXA Other injury of unspecified body region, initial encounter: Secondary | ICD-10-CM | POA: Diagnosis not present

## 2022-01-29 DIAGNOSIS — I252 Old myocardial infarction: Secondary | ICD-10-CM | POA: Insufficient documentation

## 2022-01-29 DIAGNOSIS — I319 Disease of pericardium, unspecified: Secondary | ICD-10-CM | POA: Diagnosis not present

## 2022-01-29 DIAGNOSIS — I5A Non-ischemic myocardial injury (non-traumatic): Secondary | ICD-10-CM | POA: Diagnosis not present

## 2022-01-29 DIAGNOSIS — I97630 Postprocedural hematoma of a circulatory system organ or structure following a cardiac catheterization: Secondary | ICD-10-CM | POA: Diagnosis not present

## 2022-01-29 NOTE — Patient Instructions (Signed)
Medication Instructions:  Your physician recommends that you continue on your current medications as directed. Please refer to the Current Medication list given to you today.  *If you need a refill on your cardiac medications before your next appointment, please call your pharmacy*   Lab Work: TODAY:  CBC, ESR, & CRP  If you have labs (blood work) drawn today and your tests are completely normal, you will receive your results only by: [place] (if you have MyChart) OR A paper copy in the mail If you have any lab test that is abnormal or we need to change your treatment, we will call you to review the results.   Testing/Procedures: None ordered   Follow-Up: At [facility], you and your health needs are our priority.  As part of our continuing mission to provide you with exceptional heart care, we have created designated Provider Care Teams.  These Care Teams include your primary Cardiologist (physician) and Advanced Practice Providers (APPs -  Physician Assistants and Nurse Practitioners) who all work together to provide you with the care you need, when you need it.  We recommend signing up for the patient portal called "MyChart".  Sign up information is provided on this After Visit Summary.  MyChart is used to connect with patients for Virtual Visits (Telemedicine).  Patients are able to view lab/test results, encounter notes, upcoming appointments, etc.  Non-urgent messages can be sent to your provider as well.   To learn more about what you can do with MyChart, go to [email or web address].    Your next appointment:   6 week(s)  The format for your next appointment:   In Person  Provider:   Richardson Dopp, PA-C         Other Instructions   Important Information About Sugar

## 2022-01-29 NOTE — Progress Notes (Addendum)
Cardiology Office Note:    Date:  02/05/2022   ID:  Ruth, Gilmore 1960/08/15, MRN [id number]  PCP:  Kathyrn Lass   [place] Providers Cardiologist:  Larae Grooms, MD     Referring MD: No ref. provider found   CC: Here for hospital follow-up   History of Present Illness:    Ruth Gilmore is a 61 y.o. 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  EKG demonstrated sinus rhythm with mild ST elevation in leads I, II, and V3 with Q waves in V2.  Troponin 1361, nothing acute with 2 view chest x-ray.  Labs overall unremarkable.  Left heart cath did not reveal any apparent CAD, LVEF 55 to 65% as seen on cardiac cath.  Post cath echocardiogram revealed EF 55 to 60%, left ventricle demonstrated regional wall motion abnormalities (relative apical hypokinesis without apical ballooning).  Grade 1 DD.  Cardiac MRI revealed LVEF 45% with findings meeting [facility] criteria for myopericarditis, trivial pericardial effusion posterior and lateral to LV, normal cardiac valves with normal ascending thoracic aorta, [place] uptake and RV free wall pericardium and LV apical myocardium, elevated T2 and apical LV myocardium 66 msec.  Dr. Johnsie Cancel recommended high-dose NSAIDs of ibuprofen 400 mg 3 times daily for 2 weeks and colchicine 0.6 mg twice daily for 3 months, started on PPI for GI protection.    Today she presents for hospital follow-up.  She states she is doing well since she was discharged. Denies any CP, SHOB, palpitations, syncope, presyncope, dizziness, orthopnea, PND, swelling, any acute bleeding, significant weight changes, or claudication.Tolerating medications well. Did have an episode of flushing,  feeling winded, and diarrhea after eating 1/3 can of peanuts yesterday, has since resolved.Typically happens if she eats a lot of peanuts, denies any allergy to peanuts. Overall says she is feeling well. Denies any other questions or concerns.   Past Medical History:  Diagnosis Date   Anemia    Bruising    Former smoker    Hematoma    History of iron deficiency anemia    History of ST elevation myocardial infarction (STEMI)    No CAD via Cardiac cath, r/t myopericardititis   Myopericarditis     Past Surgical History:  Procedure Laterality Date   BREAST LUMPECTOMY Left not sure of yr   benign cyst, maybe 5 years ago   LEFT HEART CATH AND CORONARY ANGIOGRAPHY N/A 01/17/2022   Procedure: LEFT HEART CATH AND CORONARY ANGIOGRAPHY;  Surgeon: Jettie Booze, MD;  Location: [place] [facility];  Service: Cardiovascular;  Laterality: N/A;   TONSILLECTOMY      Current Medications: Current Meds  Medication Sig   ascorbic acid (VITAMIN C) 500 MG tablet Take 500 mg by mouth daily.   aspirin EC 81 MG tablet Take 1 tablet (81 mg total) by mouth daily. Swallow whole.   colchicine 0.6 MG tablet Take 1 tablet (0.6 mg total) by mouth 2 (two) times daily.   [EXPIRED] ibuprofen (ADVIL) 400 MG tablet Take 1 tablet (400 mg total) by mouth 3 (three) times daily for 14 days.   metoprolol tartrate (LOPRESSOR) 25 MG tablet Take 1 tablet (25 mg total) by mouth 2 (two) times daily.   pantoprazole (PROTONIX) 20 MG tablet  Take 1 tablet (20 mg total) by mouth daily.     Allergies:   Cortisone   Social History   Socioeconomic History   Marital status: Married    Spouse name: Not on file   Number of children: 1   Years of education: Not on file   Highest education level: Bachelor's degree (e.g., BA, AB, BS)  Occupational History   Not on file  Tobacco Use   Smoking status: Former    Types: Cigarettes   Smokeless tobacco: Never  Vaping Use   Vaping Use: Never used  Substance and Sexual Activity    Alcohol use: Never   Drug use: Never   Sexual activity: Yes    Birth control/protection: Post-menopausal  Other Topics Concern   Not on file  Social History Narrative   Not on file   Social Determinants of Health   Financial Resource Strain: Not on file  Food Insecurity: Unknown (01/17/2022)   Hunger Vital Sign    Worried About Running Out of Food in the Last Year: Patient refused    [place] in the Last Year: Patient refused  Transportation Needs: No Transportation Needs (01/17/2022)   PRAPARE - Hydrologist (Medical): No    Lack of Transportation (Non-Medical): No  Physical Activity: Not on file  Stress: Not on file  Social Connections: Not on file     Family History: The patient's family history includes Anemia in her daughter; Hypertension in her father; Leukemia in her mother; Stroke in her father.  ROS:   Review of Systems  Constitutional: Negative.   HENT: Negative.    Eyes: Negative.   Respiratory: Negative.    Cardiovascular: Negative.   Gastrointestinal: Negative.   Genitourinary: Negative.   Musculoskeletal: Negative.   Skin: Negative.   Neurological: Negative.   Endo/Heme/Allergies:  Negative for environmental allergies and polydipsia. Bruises/bleeds easily.  Psychiatric/Behavioral: Negative.      Please see the history of present illness.    All other systems reviewed and are negative.  EKGs/Labs/Other Studies Reviewed:    The following studies were reviewed today:   EKG:  EKG is not ordered today.   cMRI on January 19, 2022: 1.  Mild LVE with distal septal and apical hypokinesis LVEF 45% 2. Gadolinium uptake in RV free wall pericardium and LV apical myocardium 3.  Elevated T2 in apical LV myocardium 66 msec 4.  Findings meet [facility] criteria for myopericarditis 5.  Normal RV size and function RVEF 50% 6.  Trivial pericardial effusion posterior and lateral to LV 7.  Normal cardiac valves 8.  Normal  ascending thoracic aorta 3.2 cm.  2D echocardiogram on January 18, 2022:  1. Left ventricular ejection fraction, by estimation, is 55 to 60%. The  left ventricle has normal function. The left ventricle demonstrates  regional wall motion abnormalities (relative apical hypokinesis without  apical ballooning). Left ventricular  diastolic parameters are consistent with Grade I diastolic dysfunction  (impaired relaxation).   2. Right ventricular systolic function is normal. The right ventricular  size is normal. There is normal pulmonary artery systolic pressure.   3. The mitral valve is normal in structure. No evidence of mitral valve  regurgitation. No evidence of mitral stenosis.   4. The aortic valve was not well visualized. Aortic valve regurgitation  is not visualized.   5. The inferior vena cava is normal in size with greater than 50%  respiratory variability, suggesting right atrial pressure of 3  mmHg.   Comparison(s): No prior Echocardiogram.   Conclusion(s)/Recommendation(s): Consistent with stress cardiomyopathy or  LAD disease.  Left heart cath and coronary angiography on January 17, 2022:  The left ventricular systolic function is normal.   LV end diastolic pressure is normal.   The left ventricular ejection fraction is 55-65% by visual estimate.   There is no aortic valve stenosis.   No angiographically apparent coronary artery disease.   Abnormal ECG without any obstructive coronary artery disease.  Elevated troponin noted.  Would check cardiac MRI for evidence of myocarditis.   In addition, the patient has increased her exercise significantly.  She now walks 10 miles per day for exercise.  I explained to her that this is more than needed for cardiac benefit.  Continue healthy lifestyle in general including healthy diet.  2 view chest x-ray on January 17, 2022: No active cardiopulmonary disease  Recent Labs: 01/17/2022: ALT 14; Magnesium 1.9; TSH 1.540 01/18/2022: BUN  18; Creatinine, Ser 0.98; Potassium 3.8; Sodium 138 01/29/2022: Hemoglobin 13.3; Platelets 262  Recent Lipid Panel    Component Value Date/Time   CHOL 163 01/17/2022 2039   TRIG 101 01/17/2022 2039   HDL 49 01/17/2022 2039   CHOLHDL 3.3 01/17/2022 2039   VLDL 20 01/17/2022 2039   LDLCALC NOT CALCULATED 01/17/2022 2039   Physical Exam:    VS:  BP 110/70   Pulse 84   Ht _0  (1.778 m)   Wt 181 lb 9.6 oz (82.4 kg)   SpO2 96%   BMI 26.06 kg/m     Wt Readings from Last 3 Encounters:  01/29/22 181 lb 9.6 oz (82.4 kg)  01/19/22 179 lb 3.2 oz (81.3 kg)  11/21/19 216 lb 9.6 oz (98.2 kg)     GEN: Well nourished, well developed 61 y.o. African American female in no acute distress HEENT: Normal NECK: No JVD; No carotid bruits CARDIAC: S1/S2, RRR, no murmurs, rubs, gallops; 2+ peripheral pulses throughout, strong and equal bilaterally RESPIRATORY:  Clear and diminished to auscultation without rales, wheezing or rhonchi  MUSCULOSKELETAL:  No edema; No deformity  SKIN: Significant bruising on anterior aspect of right forearm, previous hematoma that has resolved around right radial access site, bruising to posterior left calf from previous injury, with penny sized hematoma noted to be superior to bruise.  Overall, warm and dry NEUROLOGIC:  Alert and oriented x 3 PSYCHIATRIC:  Normal affect   ASSESSMENT:    1. Myocardial injury   2. Myopericarditis   3. Pericardial effusion   4. Bruising   5. Hematoma    PLAN:    In order of problems listed above:  Myocardial injury related to myopericarditis Etiology for myopericardititis unclear. Doing very well since her hospital discharge.  She will continue Advil 400 mg by mouth 3 times daily for the next week and then stop.  Continue colchicine 0.6 mg twice daily x3 months.  Continue PPI and taking medications with meals.  Continue Lopressor and aspirin. Plan to arrange follow-up TTE at next OV. ED precautions and activity precautions  discussed. Heart healthy diet and light cardiovascular exercise encouraged. May need to consider rheumatology work-up and/or referral at next OV. Will obtain ESR and CRP today.   2. Pericardial effusion Trivial pericardial effusion noted on cMRI, posterior and lateral to LV. Not identified on TTE.No friction rub heard on exam and pt denies any symptoms. Plan to arrange follow-up TTE at next OV. ED precautions discussed.   3. Bruising, history of hematoma Improving  bruising and hematoma has resolved around right radial access site. Does have bruising to posterior calf along left leg and penny sized hematoma from previous injury. Will obtain CBC today. Care precautions discussed.   4. Disposition: Follow-up with APP in 6 weeks or sooner if anything changes.    Medication Adjustments/Labs and Tests Ordered: Current medicines are reviewed at length with the patient today.  Concerns regarding medicines are outlined above.  Orders Placed This Encounter  Procedures   CBC   Sedimentation rate   CRP High sensitivity   No orders of the defined types were placed in this encounter.   Patient Instructions  Medication Instructions:  Your physician recommends that you continue on your current medications as directed. Please refer to the Current Medication list given to you today.  *If you need a refill on your cardiac medications before your next appointment, please call your pharmacy*   Lab Work: TODAY:  CBC, ESR, & CRP  If you have labs (blood work) drawn today and your tests are completely normal, you will receive your results only by: [place] (if you have MyChart) OR A paper copy in the mail If you have any lab test that is abnormal or we need to change your treatment, we will call you to review the results.   Testing/Procedures: None ordered   Follow-Up: At [facility], you and your health needs are our priority.  As part of our continuing mission to provide you  with exceptional heart care, we have created designated Provider Care Teams.  These Care Teams include your primary Cardiologist (physician) and Advanced Practice Providers (APPs -  Physician Assistants and Nurse Practitioners) who all work together to provide you with the care you need, when you need it.  We recommend signing up for the patient portal called "MyChart".  Sign up information is provided on this After Visit Summary.  MyChart is used to connect with patients for Virtual Visits (Telemedicine).  Patients are able to view lab/test results, encounter notes, upcoming appointments, etc.  Non-urgent messages can be sent to your provider as well.   To learn more about what you can do with MyChart, go to [email or web address].    Your next appointment:   6 week(s)  The format for your next appointment:   In Person  Provider:   Richardson Dopp, PA-C         Other Instructions   Important Information About Sugar         Signed, Finis Bud, NP  02/05/2022 9:05 PM    [place]

## 2022-01-30 LAB — CBC
Hematocrit: 40.2 % (ref 34.0–46.6)
Hemoglobin: 13.3 g/dL (ref 11.1–15.9)
MCH: 28.7 pg (ref 26.6–33.0)
MCHC: 33.1 g/dL (ref 31.5–35.7)
MCV: 87 fL (ref 79–97)
Platelets: 262 10*3/uL (ref 150–450)
RBC: 4.63 x10E6/uL (ref 3.77–5.28)
RDW: 14.6 % (ref 11.7–15.4)
WBC: 4.4 10*3/uL (ref 3.4–10.8)

## 2022-01-30 LAB — SEDIMENTATION RATE: Sed Rate: 12 mm/hr (ref 0–40)

## 2022-01-30 LAB — HIGH SENSITIVITY CRP: CRP, High Sensitivity: 0.85 mg/L (ref 0.00–3.00)

## 2022-02-08 NOTE — Addendum Note (Signed)
Addended by: Sharlene Dory on: 02/08/2022 05:06 PM   Modules accepted: Level of Service

## 2022-03-10 NOTE — Progress Notes (Unsigned)
Cardiology Office Note:    Date:  03/11/2022   ID:  Ziona, Wickens 1960/09/05, MRN [id number]  PCP:  Aviva Kluver  [facility] Providers Cardiologist:  Lance Muss, MD    Referring MD: No ref. provider found   Chief Complaint:  F/u for myopericarditis     Patient Profile: Myopericarditis  Admx with chest pain, ST elevation and + [place] 12/2021 >> no CAD on cath Type II MI  CMR 01/19/22: EF 45, LGE in RV free wall pericardium and LV apical myocardium - c/w myopericarditis, RVEF 50, trivial eff TTE 01/18/22: EF 55-60, apical HK, Gr 1 DD, NL RVSF, NL PASP, RAP 3 LHC 01/17/22: No CAD, EF 55-65 Anemia     History of Present Illness:   Ruth Gilmore is a 61 y.o. female with the above problem list.  She was admitted in Oct 2023 with myopericarditis. Plan was to continue NSAIDs x 2 weeks Colchicine x 3 mos. She was last seen in clinic by Sharlene Dory, NP 01/29/22.  She returns for f/u. She is here with her husband. She is doing well without exertional chest pain, shortness of breath, pleuritic chest pain or chest pain with lying supine. She walks for exercise. She is walking about 6.5 miles a day. She has no limitations with this. She takes frequent breaks.     EKG:  not done    Reviewed and updated this encounter:   Tobacco  Allergies  Meds  Problems  Med Hx  Surg Hx  Fam Hx     ROS  Labs/Other Test Reviewed:   Recent Labs: 01/17/2022: ALT 14; Magnesium 1.9; TSH 1.540 01/18/2022: BUN 18; Creatinine, Ser 0.98; Potassium 3.8; Sodium 138 01/29/2022: Hemoglobin 13.3; Platelets 262   Recent Lipid Panel Recent Labs    01/17/22 2039  CHOL 163  TRIG 101  HDL 49  VLDL 20  LDLCALC NOT CALCULATED    Risk Assessment/Calculations/Metrics:             Physical Exam:   VS:  BP 118/80   Pulse 70   Ht 5\' 10"  (1.778 m)   Wt 173 lb 3.2 oz (78.6 kg)   SpO2 96%   BMI 24.85 kg/m    Wt Readings from Last 3 Encounters:  03/11/22 173 lb 3.2 oz  (78.6 kg)  01/29/22 181 lb 9.6 oz (82.4 kg)  01/19/22 179 lb 3.2 oz (81.3 kg)    Constitutional:      Appearance: Healthy appearance. Not in distress.  Pulmonary:     Breath sounds: No wheezing. No rales.  Cardiovascular:     Normal rate. Regular rhythm. Normal S1. Normal S2.      Murmurs: There is no murmur.     No rub.  Edema:    Peripheral edema absent.  Abdominal:     Palpations: There is no hepatomegaly.         ASSESSMENT & PLAN:   Myopericarditis She is doing very well.  She remains very active.  She does not have exertional chest pain or pleuritic symptoms.  She is not short of breath.  We discussed the potential etiologies for myopericarditis.  She does not have any prior history of autoimmune disorder or symptoms to suggest an underlying inflammatory disease.  Specifically, she denies joint pain or swelling, skin changes, pulmonary issues or GI issues.  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  She understands to continue colchicine for total of 3 months and then stop.  She is no  longer on NSAIDs.  Technically, she ruled in for myocardial infarction (type II myocardial infarction).  Continue aspirin 81 mg daily, metoprolol tartrate 25 mg twice daily.  EF was slightly low on cardiac MRI 45.  EF was normal on echocardiogram.  Arrange follow-up limited echocardiogram in 3 months.  Follow-up with Dr. Eldridge Dace afterward.           Dispo:  Return in about 3 months (around 06/10/2022) for Follow up after testing w/ Dr. Eldridge Dace.  Medication Adjustments/Labs and Tests Ordered: Current medicines are reviewed at length with the patient today.  Concerns regarding medicines are outlined above.  Tests Ordered: Orders Placed This Encounter  Procedures   ECHOCARDIOGRAM LIMITED   Medication Changes: No orders of the defined types were placed in this encounter.  Signed, Tereso Newcomer, PA-C  03/11/2022 12:11 PM    [facility] [address] [place], [place], [place]   [address] Phone: [phone]; Fax: [phone]

## 2022-03-11 ENCOUNTER — Encounter: Payer: Self-pay | Admitting: Physician Assistant

## 2022-03-11 ENCOUNTER — Ambulatory Visit: Payer: BC Managed Care – PPO | Attending: Physician Assistant | Admitting: Physician Assistant

## 2022-03-11 VITALS — BP 118/80 | HR 70 | Ht 70.0 in | Wt 173.2 lb

## 2022-03-11 DIAGNOSIS — I319 Disease of pericardium, unspecified: Secondary | ICD-10-CM | POA: Diagnosis not present

## 2022-03-11 NOTE — Patient Instructions (Signed)
Medication Instructions:  Your physician recommends that you continue on your current medications as directed. Please refer to the Current Medication list given to you today.  *If you need a refill on your cardiac medications before your next appointment, please call your pharmacy*   Lab Work: None ordered  If you have labs (blood work) drawn today and your tests are completely normal, you will receive your results only by: MyChart Message (if you have MyChart) OR A paper copy in the mail If you have any lab test that is abnormal or we need to change your treatment, we will call you to review the results.   Testing/Procedures: Your physician has requested that you have an LIMTED echocardiogram. Echocardiography is a painless test that uses sound waves to create images of your heart. It provides your doctor with information about the size and shape of your heart and how well your heart's chambers and valves are working. This procedure takes approximately one hour. There are no restrictions for this procedure. Please do NOT wear cologne, perfume, aftershave, or lotions (deodorant is allowed). Please arrive 15 minutes prior to your appointment time.    Follow-Up: At [facility], you and your health needs are our priority.  As part of our continuing mission to provide you with exceptional heart care, we have created designated Provider Care Teams.  These Care Teams include your primary Cardiologist (physician) and Advanced Practice Providers (APPs -  Physician Assistants and Nurse Practitioners) who all work together to provide you with the care you need, when you need it.  We recommend signing up for the patient portal called "MyChart".  Sign up information is provided on this After Visit Summary.  MyChart is used to connect with patients for Virtual Visits (Telemedicine).  Patients are able to view lab/test results, encounter notes, upcoming appointments, etc.  Non-urgent messages  can be sent to your provider as well.   To learn more about what you can do with MyChart, go to [email or web address].    Your next appointment:   AFTER ECHO   The format for your next appointment:   In Person  Provider:   Lance Muss, MD     Other Instructions   Important Information About Sugar

## 2022-03-11 NOTE — Assessment & Plan Note (Addendum)
She is doing very well.  She remains very active.  She does not have exertional chest pain or pleuritic symptoms.  She is not short of breath.  We discussed the potential etiologies for myopericarditis.  She does not have any prior history of autoimmune disorder or symptoms to suggest an underlying inflammatory disease.  Specifically, she denies joint pain or swelling, skin changes, pulmonary issues or GI issues.  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  She understands to continue colchicine for total of 3 months and then stop.  She is no longer on NSAIDs.  Technically, she ruled in for myocardial infarction (type II myocardial infarction).  Continue aspirin 81 mg daily, metoprolol tartrate 25 mg twice daily.  EF was slightly low on cardiac MRI 45.  EF was normal on echocardiogram.  Arrange follow-up limited echocardiogram in 3 months.  Follow-up with Dr. Eldridge Dace afterward.

## 2022-04-08 ENCOUNTER — Ambulatory Visit (HOSPITAL_COMMUNITY): Payer: BC Managed Care – PPO | Attending: Cardiology

## 2022-04-08 DIAGNOSIS — I319 Disease of pericardium, unspecified: Secondary | ICD-10-CM | POA: Diagnosis present

## 2022-04-08 LAB — ECHOCARDIOGRAM LIMITED
Area-P 1/2: 2.56 cm2
S' Lateral: 2.5 cm

## 2022-04-12 ENCOUNTER — Other Ambulatory Visit: Payer: Self-pay | Admitting: Student

## 2022-06-16 NOTE — Progress Notes (Unsigned)
Cardiology Office Note   Date:  06/17/2022   ID:  Ruth [place], Ruth [place] 03-31-1960, MRN [id number]  PCP:  Pcp, No    No chief complaint on file.  Myopericarditis  Wt Readings from Last 3 Encounters:  06/17/22 186 lb (84.4 kg)  03/11/22 173 lb 3.2 oz (78.6 kg)  01/29/22 181 lb 9.6 oz (82.4 kg)       History of Present Illness: Ruth [place] is a 62 y.o. female with history of myopericarditis in October 2023.  She was admitted in Oct 2023 with myopericarditis. Plan was to continue NSAIDs x 2 weeks Colchicine x 3 mos.   Admx with chest pain, ST elevation and + Trops 12/2021 >> no CAD on cath Type II MI  CMR 01/19/22: EF 45, LGE in RV free wall pericardium and LV apical myocardium - c/w myopericarditis, RVEF 50, trivial eff TTE 01/18/22: EF 55-60, apical HK, Gr 1 DD, NL RVSF, NL PASP, RAP 3   Cath from October 2023 showed: "The left ventricular systolic function is normal.   LV end diastolic pressure is normal.   The left ventricular ejection fraction is 55-65% by visual estimate.   There is no aortic valve stenosis.   No angiographically apparent coronary artery disease.   Abnormal ECG without any obstructive coronary artery disease.  Elevated troponin noted.  Would check cardiac MRI for evidence of myocarditis.  Had been on a [employer] bus to [place].   In addition, the patient has increased her exercise significantly.  She now walks 10 miles per day for exercise.  I explained to her that this is more than needed for cardiac benefit.  Continue healthy lifestyle in general including healthy diet."  As of December 2023: "She is doing well without exertional chest pain, shortness of breath, pleuritic chest pain or chest pain with lying supine. She walks for exercise. She is walking about 6.5 miles a day. She has no limitations with this. She takes frequent breaks. "  EF 45% by MRI. Normal LVEF by echo.   Has not been walking less in the last month.   Up to 5 miles at a time.   Past Medical History:  Diagnosis Date   Anemia    Bruising    Former smoker    Hematoma    History of iron deficiency anemia    History of ST elevation myocardial infarction (STEMI)    No CAD via Cardiac cath, r/t myopericardititis   Myopericarditis     Past Surgical History:  Procedure Laterality Date   BREAST LUMPECTOMY Left not sure of yr   benign cyst, maybe 5 years ago   LEFT HEART CATH AND CORONARY ANGIOGRAPHY N/A 01/17/2022   Procedure: LEFT HEART CATH AND CORONARY ANGIOGRAPHY;  Surgeon: Jettie Booze, MD;  Location: [facility];  Service: Cardiovascular;  Laterality: N/A;   TONSILLECTOMY       Current Outpatient Medications  Medication Sig Dispense Refill   ascorbic acid (VITAMIN C) 500 MG tablet Take 500 mg by mouth daily.     aspirin EC 81 MG tablet Take 1 tablet (81 mg total) by mouth daily. Swallow whole. 30 tablet 12   metoprolol tartrate (LOPRESSOR) 25 MG tablet Take 1 tablet (25 mg total) by mouth 2 (two) times daily. 180 tablet 1   pantoprazole (PROTONIX) 20 MG tablet Take 1 tablet (20 mg total) by mouth daily. (Patient taking differently: Take 20 mg by mouth daily. PRN) 90 tablet 1  colchicine 0.6 MG tablet Take 1 tablet (0.6 mg total) by mouth 2 (two) times daily. 180 tablet 0   No current facility-administered medications for this visit.    Allergies:   Cortisone    Social History:  The patient  reports that she has quit smoking. Her smoking use included cigarettes. She has never used smokeless tobacco. She reports that she does not drink alcohol and does not use drugs.   Family History:  The patient's family history includes Anemia in her daughter; Hypertension in her father; Leukemia in her mother; Stroke in her father.    ROS:  Please see the history of present illness.   Otherwise, review of systems are positive for les exercise of late.   All other systems are reviewed and negative.    PHYSICAL EXAM: VS:   BP 126/86   Pulse 80   Ht 5\' 10"  (1.778 m)   Wt 186 lb (84.4 kg)   SpO2 96%   BMI 26.69 kg/m  , BMI Body mass index is 26.69 kg/m. GEN: Well nourished, well developed, in no acute distress HEENT: normal Neck: no JVD, carotid bruits, or masses Cardiac: RRR; no murmurs, rubs, or gallops,no edema  Respiratory:  clear to auscultation bilaterally, normal work of breathing GI: soft, nontender, nondistended, + BS MS: no deformity or atrophy Skin: warm and dry, no rash Neuro:  Strength and sensation are intact Psych: euthymic mood, full affect    Recent Labs: 01/17/2022: ALT 14; Magnesium 1.9; TSH 1.540 01/18/2022: BUN 18; Creatinine, Ser 0.98; Potassium 3.8; Sodium 138 01/29/2022: Hemoglobin 13.3; Platelets 262   Lipid Panel    Component Value Date/Time   CHOL 163 01/17/2022 2039   TRIG 101 01/17/2022 2039   HDL 49 01/17/2022 2039   CHOLHDL 3.3 01/17/2022 2039   VLDL 20 01/17/2022 2039   [place] NOT CALCULATED 01/17/2022 2039     Other studies Reviewed: Additional studies/ records that were reviewed today with results demonstrating: labs: TC 163 in 2023.   ASSESSMENT AND PLAN:  Myopericarditis: sx have resolved.  Likely post viral.  Tolerating low dose metoprolol.   Can stop aspirin at this point since no CAD.  Ok to take fish oil.  Ok to take B12. Ok to get back to walking, 3-5 miles/day 5-6 days a week.    Current medicines are reviewed at length with the patient today.  The patient concerns regarding her medicines were addressed.  The following changes have been made:  No change  Labs/ tests ordered today include:  No orders of the defined types were placed in this encounter.   Recommend 150 minutes/week of aerobic exercise Low fat, low carb, high fiber diet recommended  Disposition:   FU in 1 year   Signed, Larae Grooms, MD  06/17/2022 10:39 AM    [facility] [address], [place], [place]  [address] Phone: [phone]; Fax:  [phone]

## 2022-06-17 ENCOUNTER — Ambulatory Visit: Payer: BC Managed Care – PPO | Attending: Interventional Cardiology | Admitting: Interventional Cardiology

## 2022-06-17 ENCOUNTER — Encounter: Payer: Self-pay | Admitting: Interventional Cardiology

## 2022-06-17 VITALS — BP 126/86 | HR 80 | Ht 70.0 in | Wt 186.0 lb

## 2022-06-17 DIAGNOSIS — I3139 Other pericardial effusion (noninflammatory): Secondary | ICD-10-CM

## 2022-06-17 DIAGNOSIS — I319 Disease of pericardium, unspecified: Secondary | ICD-10-CM

## 2022-06-17 NOTE — Patient Instructions (Signed)
Medication Instructions:  Your physician has recommended you make the following change in your medication: Stop aspirin  *If you need a refill on your cardiac medications before your next appointment, please call your pharmacy*   Lab Work: none If you have labs (blood work) drawn today and your tests are completely normal, you will receive your results only by: [place] (if you have MyChart) OR A paper copy in the mail If you have any lab test that is abnormal or we need to change your treatment, we will call you to review the results.   Testing/Procedures: none   Follow-Up: At [facility], you and your health needs are our priority.  As part of our continuing mission to provide you with exceptional heart care, we have created designated Provider Care Teams.  These Care Teams include your primary Cardiologist (physician) and Advanced Practice Providers (APPs -  Physician Assistants and Nurse Practitioners) who all work together to provide you with the care you need, when you need it.  We recommend signing up for the patient portal called "MyChart".  Sign up information is provided on this After Visit Summary.  MyChart is used to connect with patients for Virtual Visits (Telemedicine).  Patients are able to view lab/test results, encounter notes, upcoming appointments, etc.  Non-urgent messages can be sent to your provider as well.   To learn more about what you can do with MyChart, go to [email or web address].    Your next appointment:   12 month(s)  Provider:   Larae Grooms, MD

## 2022-07-08 ENCOUNTER — Other Ambulatory Visit: Payer: Self-pay | Admitting: Student

## 2023-07-22 ENCOUNTER — Other Ambulatory Visit: Payer: Self-pay | Admitting: Interventional Cardiology
